# Patient Record
Sex: Female | Born: 1995 | Hispanic: Yes | Marital: Single | Smoking: Never smoker
Health system: Southern US, Community
[De-identification: ages and names within clinical notes are randomized; demographics above are authoritative.]

## PROBLEM LIST (undated history)

## (undated) DIAGNOSIS — E669 Obesity, unspecified: Secondary | ICD-10-CM

## (undated) DIAGNOSIS — R569 Unspecified convulsions: Secondary | ICD-10-CM

## (undated) DIAGNOSIS — J45909 Unspecified asthma, uncomplicated: Secondary | ICD-10-CM

## (undated) DIAGNOSIS — T7840XA Allergy, unspecified, initial encounter: Secondary | ICD-10-CM

---
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
## 2015-04-22 ENCOUNTER — Ambulatory Visit (HOSPITAL_BASED_OUTPATIENT_CLINIC_OR_DEPARTMENT_OTHER): Payer: BLUE CROSS/BLUE SHIELD | Attending: Internal Medicine | Admitting: Radiology

## 2015-04-22 VITALS — Ht 62.0 in | Wt 220.0 lb

## 2015-04-22 DIAGNOSIS — G4719 Other hypersomnia: Secondary | ICD-10-CM | POA: Insufficient documentation

## 2015-04-22 DIAGNOSIS — G4733 Obstructive sleep apnea (adult) (pediatric): Secondary | ICD-10-CM | POA: Diagnosis not present

## 2015-04-22 DIAGNOSIS — R0683 Snoring: Secondary | ICD-10-CM | POA: Diagnosis not present

## 2015-04-22 DIAGNOSIS — Z6841 Body Mass Index (BMI) 40.0 and over, adult: Secondary | ICD-10-CM | POA: Diagnosis not present

## 2015-04-22 DIAGNOSIS — G4730 Sleep apnea, unspecified: Secondary | ICD-10-CM | POA: Diagnosis present

## 2015-04-22 DIAGNOSIS — E669 Obesity, unspecified: Secondary | ICD-10-CM | POA: Diagnosis not present

## 2015-04-27 DIAGNOSIS — G4733 Obstructive sleep apnea (adult) (pediatric): Secondary | ICD-10-CM

## 2015-04-27 NOTE — Progress Notes (Signed)
  Patient Name: Hailey Patrick, [place] Study Date: 04/22/2015 Gender: Female D.O.B: 1996-01-30 Age (years): 19 Referring Provider: Fleet ContrasEdwin Avbuere Height (inches): 62 Interpreting Physician: Jetty Duhamellinton Young MD, ABSM Weight (lbs): 220 RPSGT: Shelah LewandowskyGregory, Kenyon BMI: 40 MRN: [id number] Neck Size: 14.00 CLINICAL INFORMATION Sleep Study Type: NPSG Indication for sleep study: Excessive Daytime Sleepiness, Obesity, OSA, Snoring, Witnessed Apneas Epworth Sleepiness Score: 2  SLEEP STUDY TECHNIQUE As per the AASM Manual for the Scoring of Sleep and Associated Events v2.3 (April 2016) with a hypopnea requiring 4% desaturations. The channels recorded and monitored were frontal, central and occipital EEG, electrooculogram (EOG), submentalis EMG (chin), nasal and oral airflow, thoracic and abdominal wall motion, anterior tibialis EMG, snore microphone, electrocardiogram, and pulse oximetry.  MEDICATIONS Patient's medications include: charted for review. Medications self-administered by patient during sleep study : No sleep medicine administered.  SLEEP ARCHITECTURE The study was initiated at 10:39:17 PM and ended at 5:19:14 AM. Sleep onset time was 57.5 minutes and the sleep efficiency was 76.5%. The total sleep time was 306.0 minutes. Stage REM latency was 190.0 minutes. The patient spent 7.03% of the night in stage N1 sleep, 54.90% in stage N2 sleep, 22.22% in stage N3 and 15.85% in REM. Alpha intrusion was absent. Supine sleep was 20.10%.  RESPIRATORY PARAMETERS The overall apnea/hypopnea index (AHI) was 1.4 per hour. There were 1 total apneas, including 0 obstructive, 1 central and 0 mixed apneas. There were 6 hypopneas and 51 RERAs. The AHI during Stage REM sleep was 1.2 per hour. AHI while supine was 2.9 per hour. The mean oxygen saturation was 96.85%. The minimum SpO2 during sleep was 89.00%. Moderate snoring was noted during this study.  CARDIAC DATA The 2 lead EKG demonstrated sinus  rhythm. The mean heart rate was 77.27 beats per minute. Other EKG findings include: None.  LEG MOVEMENT DATA The total PLMS were 2 with a resulting PLMS index of 0.39. Associated arousal with leg movement index was 0.2 . IMPRESSIONS - No significant obstructive sleep apnea occurred during this study (AHI = 1.4/h). - No significant central sleep apnea occurred during this study (CAI = 0.2/h). - The patient had minimal or no oxygen desaturation during the study (Min O2 = 89.00%) - The patient snored with Moderate snoring volume. - No cardiac abnormalities were noted during this study. - Clinically significant periodic limb movements did not occur during sleep. No significant associated arousals.  DIAGNOSIS - Normal study  RECOMMENDATIONS - Avoid alcohol, sedatives and other CNS depressants that may worsen sleep apnea and disrupt normal sleep architecture. - Sleep hygiene should be reviewed to assess factors that may improve sleep quality. - Weight management and regular exercise should be initiated or continued if appropriate.  Waymon BudgeYOUNG,CLINTON D Diplomate, American Board of Sleep Medicine  ELECTRONICALLY SIGNED ON:  04/27/2015, 3:00 PM [facility] PH: [phone]   FX: [phone] ACCREDITED BY THE AMERICAN ACADEMY OF SLEEP MEDICINE

## 2016-11-30 ENCOUNTER — Encounter (HOSPITAL_COMMUNITY): Payer: Self-pay | Admitting: Emergency Medicine

## 2016-11-30 ENCOUNTER — Emergency Department (HOSPITAL_COMMUNITY): Payer: BLUE CROSS/BLUE SHIELD

## 2016-11-30 ENCOUNTER — Emergency Department (HOSPITAL_COMMUNITY)
Admission: EM | Admit: 2016-11-30 | Discharge: 2016-11-30 | Disposition: A | Discharge status: Home / Self Care | Payer: BLUE CROSS/BLUE SHIELD | Attending: Emergency Medicine | Admitting: Emergency Medicine

## 2016-11-30 DIAGNOSIS — Y92410 Unspecified street and highway as the place of occurrence of the external cause: Secondary | ICD-10-CM | POA: Diagnosis not present

## 2016-11-30 DIAGNOSIS — R109 Unspecified abdominal pain: Secondary | ICD-10-CM | POA: Insufficient documentation

## 2016-11-30 DIAGNOSIS — S30811A Abrasion of abdominal wall, initial encounter: Secondary | ICD-10-CM | POA: Diagnosis not present

## 2016-11-30 DIAGNOSIS — Y999 Unspecified external cause status: Secondary | ICD-10-CM | POA: Diagnosis not present

## 2016-11-30 DIAGNOSIS — Y939 Activity, unspecified: Secondary | ICD-10-CM | POA: Insufficient documentation

## 2016-11-30 DIAGNOSIS — T07XXXA Unspecified multiple injuries, initial encounter: Secondary | ICD-10-CM

## 2016-11-30 DIAGNOSIS — Z79899 Other long term (current) drug therapy: Secondary | ICD-10-CM | POA: Diagnosis not present

## 2016-11-30 DIAGNOSIS — Z7982 Long term (current) use of aspirin: Secondary | ICD-10-CM | POA: Diagnosis not present

## 2016-11-30 DIAGNOSIS — M79644 Pain in right finger(s): Secondary | ICD-10-CM | POA: Diagnosis present

## 2016-11-30 LAB — I-STAT CHEM 8, ED
BUN: 9 mg/dL (ref 6–20)
Calcium, Ion: 1.14 mmol/L — ABNORMAL LOW (ref 1.15–1.40)
Chloride: 103 mmol/L (ref 101–111)
Creatinine, Ser: 0.6 mg/dL (ref 0.44–1.00)
Glucose, Bld: 92 mg/dL (ref 65–99)
HEMATOCRIT: 43 % (ref 36.0–46.0)
Hemoglobin: 14.6 g/dL (ref 12.0–15.0)
Potassium: 4 mmol/L (ref 3.5–5.1)
SODIUM: 140 mmol/L (ref 135–145)
TCO2: 25 mmol/L (ref 22–32)

## 2016-11-30 LAB — CBC WITH DIFFERENTIAL/PLATELET
BASOS PCT: 0 %
Basophils Absolute: 0 10*3/uL (ref 0.0–0.1)
EOS ABS: 0.3 10*3/uL (ref 0.0–0.7)
Eosinophils Relative: 3 %
HCT: 41.7 % (ref 36.0–46.0)
Hemoglobin: 13.6 g/dL (ref 12.0–15.0)
Lymphocytes Relative: 17 %
Lymphs Abs: 1.9 10*3/uL (ref 0.7–4.0)
MCH: 27.4 pg (ref 26.0–34.0)
MCHC: 32.6 g/dL (ref 30.0–36.0)
MCV: 83.9 fL (ref 78.0–100.0)
MONO ABS: 0.5 10*3/uL (ref 0.1–1.0)
Monocytes Relative: 5 %
Neutro Abs: 8.1 10*3/uL — ABNORMAL HIGH (ref 1.7–7.7)
Neutrophils Relative %: 75 %
Platelets: 334 10*3/uL (ref 150–400)
RBC: 4.97 MIL/uL (ref 3.87–5.11)
RDW: 13.7 % (ref 11.5–15.5)
WBC: 10.7 10*3/uL — ABNORMAL HIGH (ref 4.0–10.5)

## 2016-11-30 LAB — CBG MONITORING, ED: Glucose-Capillary: 68 mg/dL (ref 65–99)

## 2016-11-30 LAB — POC URINE PREG, ED: Preg Test, Ur: NEGATIVE

## 2016-11-30 MED ORDER — IOPAMIDOL (ISOVUE-300) INJECTION 61%
INTRAVENOUS | Status: AC
  Administered 2016-11-30: 100 mL
  Filled 2016-11-30: qty 100

## 2016-11-30 MED ORDER — CYCLOBENZAPRINE HCL 10 MG PO TABS: 10.0000 mg | ORAL_TABLET | Freq: Every evening | ORAL | 0 refills | Status: DC | PRN

## 2016-11-30 NOTE — Discharge Instructions (Signed)
Please read instructions below. Apply ice to your hand for 20 minutes at a time. You can take 600 mg of Advil/ibuprofen every 6 hours as needed for pain. You can take flexeril at bedtime or up to every 12 hours as needed for muscle spasm. You will have muscle soreness these next few days and that is normal. Schedule an appointment with your primary care provider if finger pain persists.  Return to ER if new numbness or tingling in your arms or legs, inability to urinate, inability to hold your bowels, weakness in your extremities, severe headache, vision changes, vomiting or new or concerning symptoms.
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
## 2021-03-26 ENCOUNTER — Other Ambulatory Visit: Payer: Self-pay | Admitting: Internal Medicine

## 2021-03-26 ENCOUNTER — Other Ambulatory Visit: Payer: Self-pay

## 2021-03-26 ENCOUNTER — Ambulatory Visit
Admission: RE | Admit: 2021-03-26 | Discharge: 2021-03-26 | Disposition: A | Discharge status: Home / Self Care | Payer: Self-pay | Source: Ambulatory Visit | Attending: Internal Medicine | Admitting: Internal Medicine

## 2021-03-26 DIAGNOSIS — R0602 Shortness of breath: Secondary | ICD-10-CM

## 2022-04-06 IMAGING — CR DG CHEST 2V
3 series · 3 of 3 positions shown · non-contrast
Comparison: Chest radiograph 11/30/2016

CLINICAL DATA: Shortness of breath.

EXAM:
CHEST - 2 VIEW

[w chest pa]
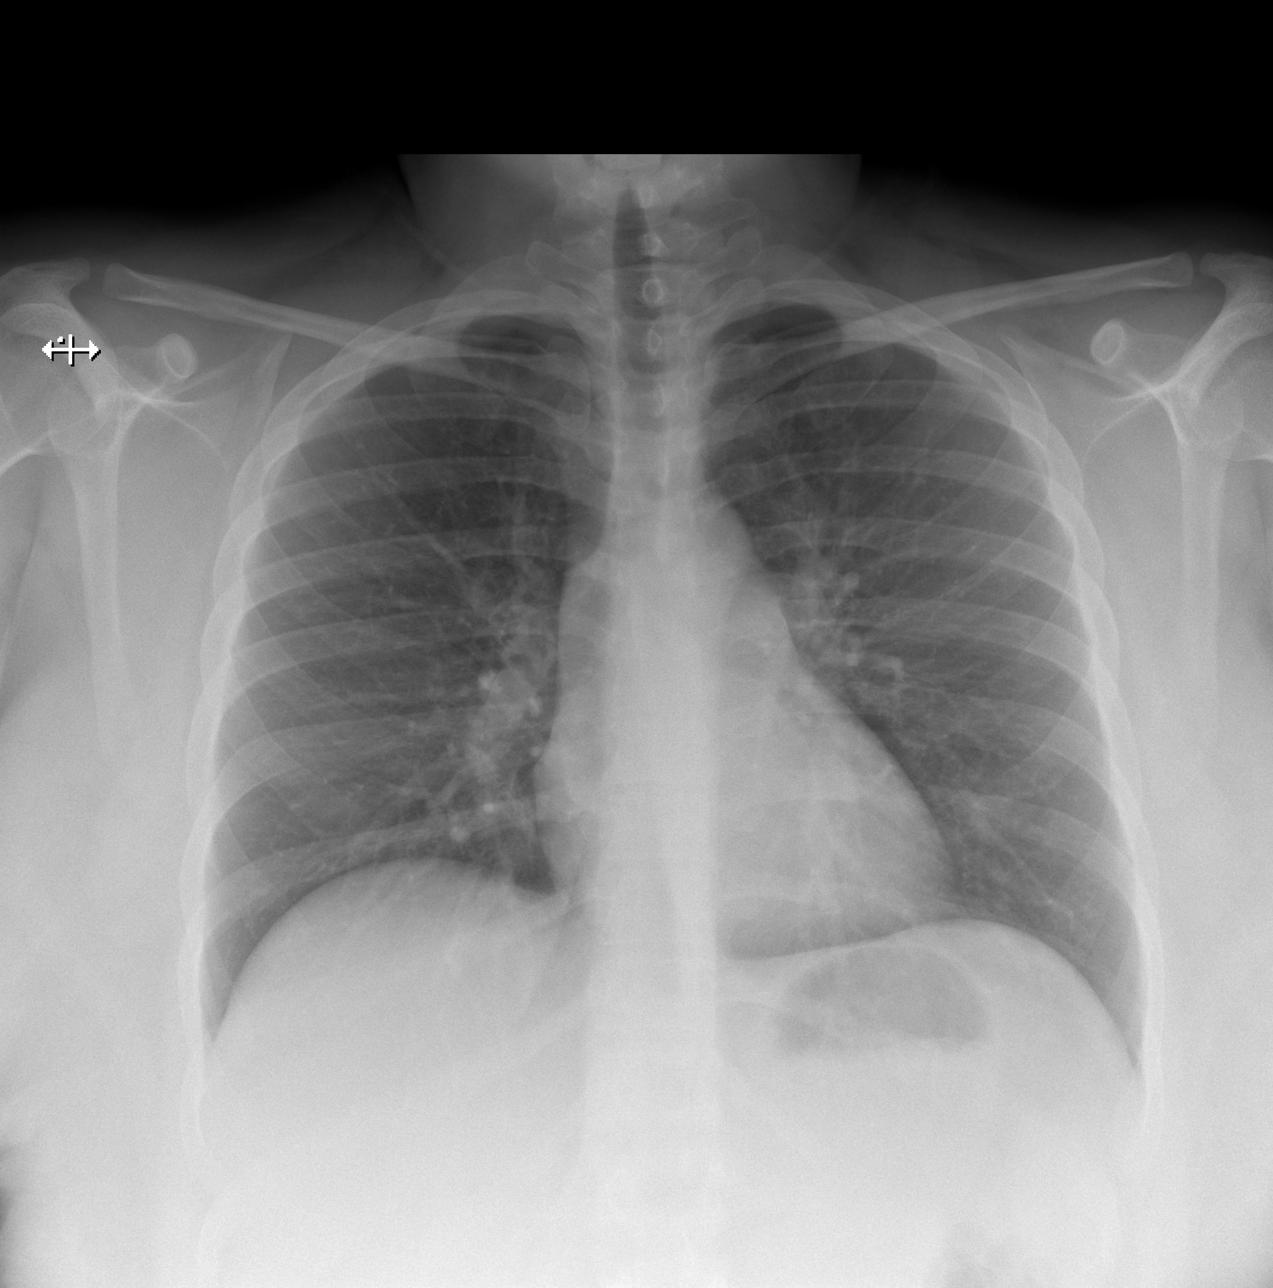

[w chest lat (1 of 2)]
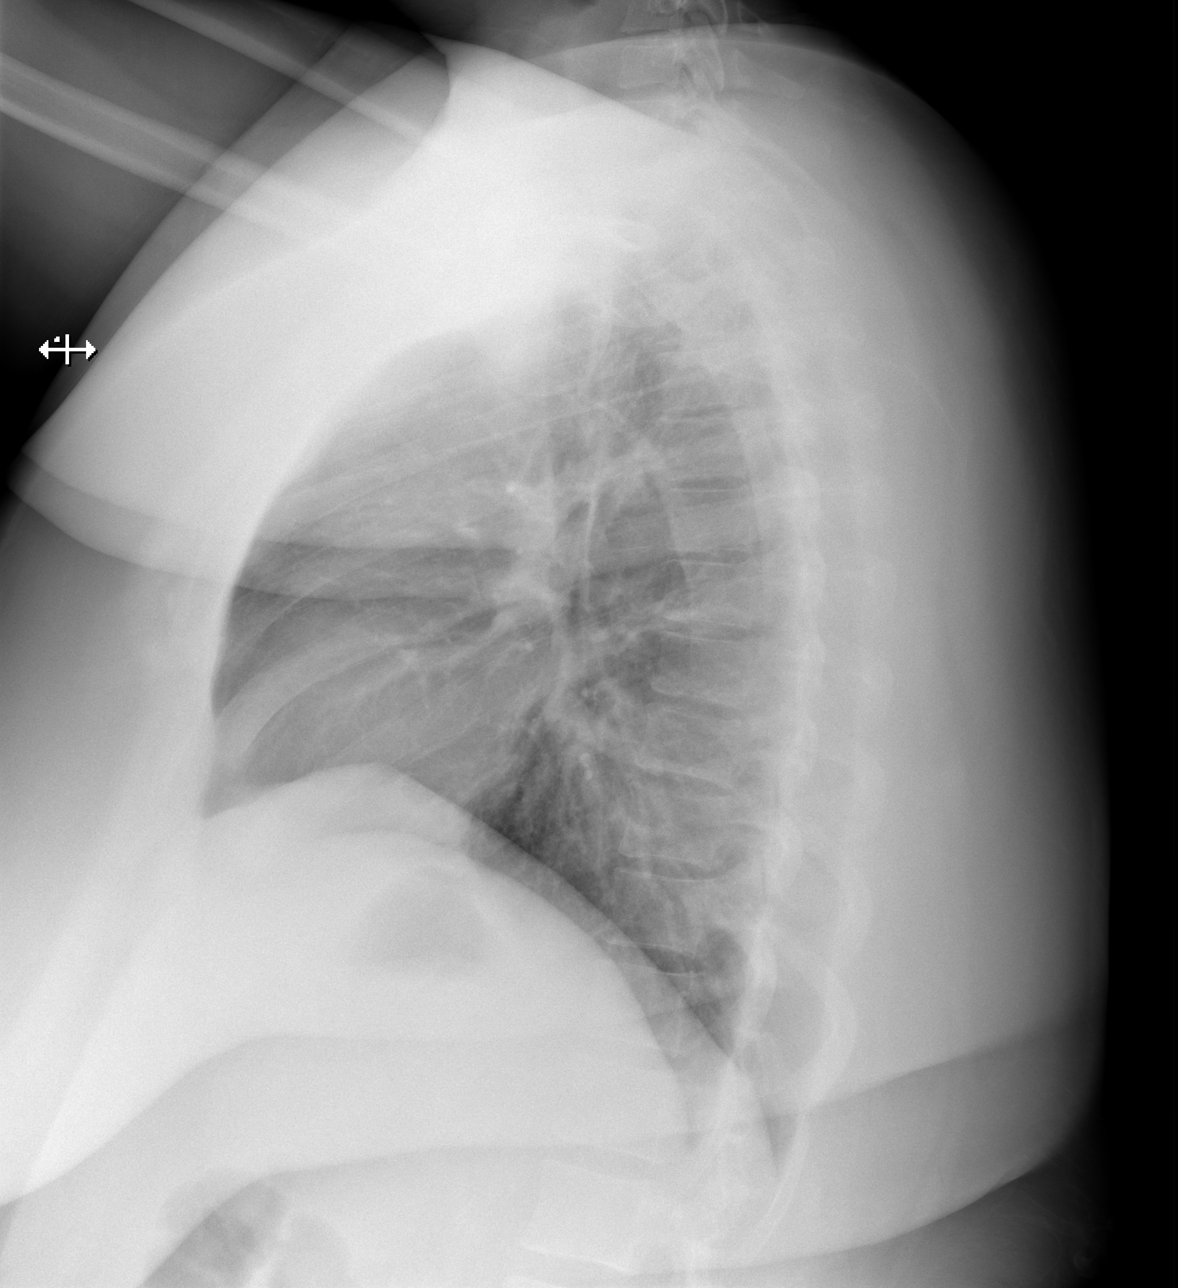

[w chest lat (2 of 2)]
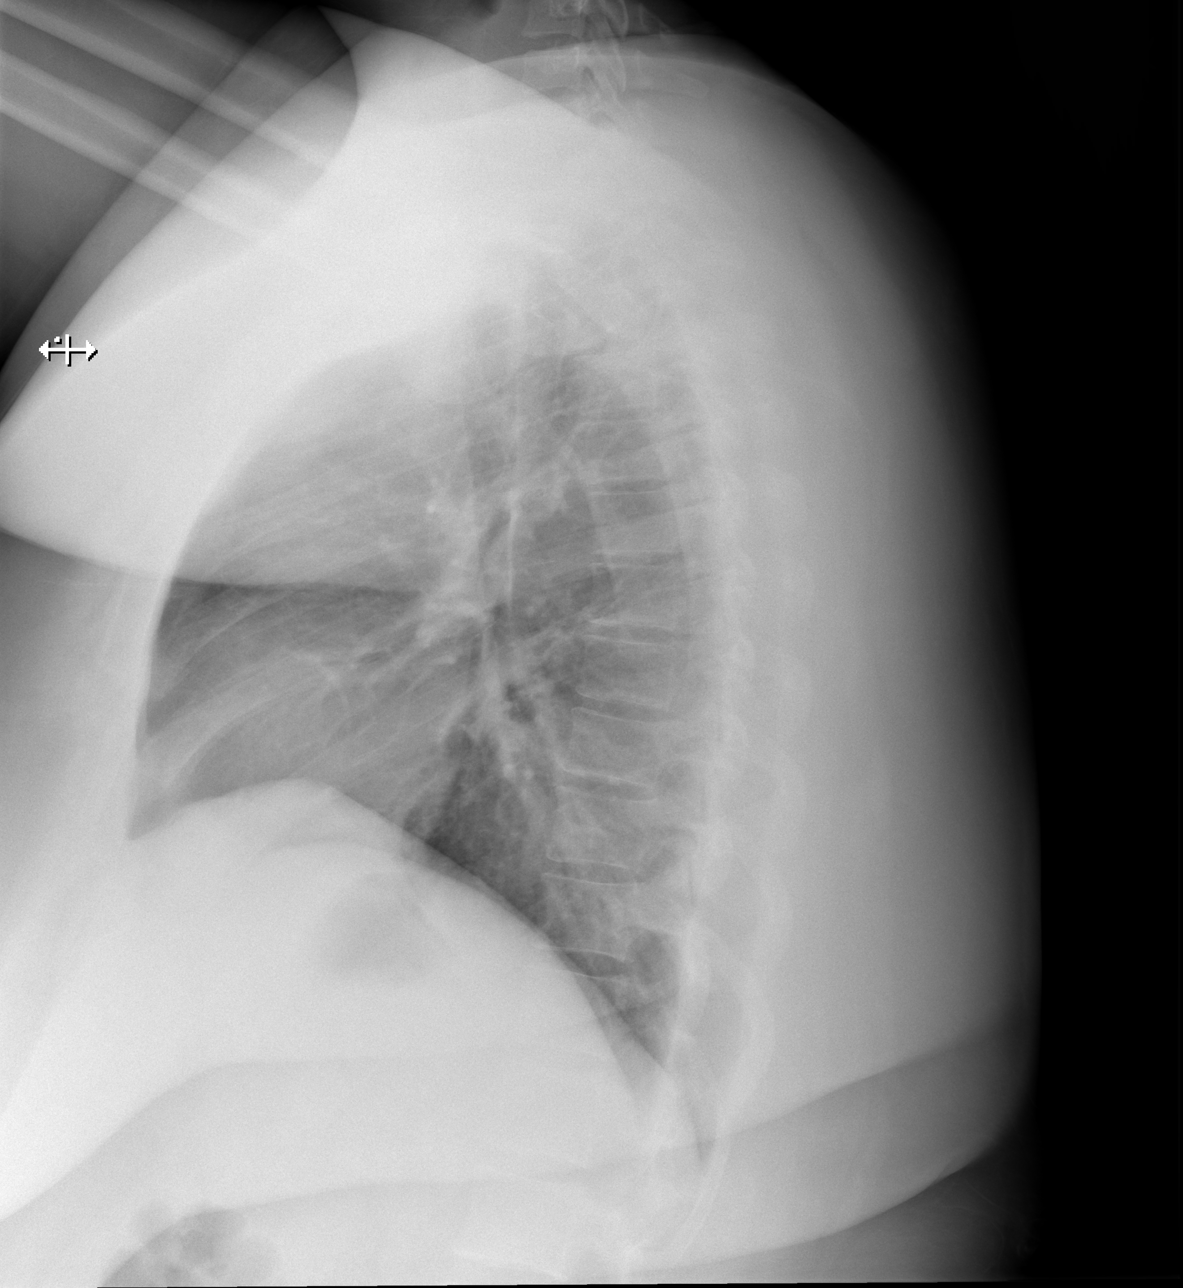

[3 of 3 positions shown; findings below may reference images not displayed]

FINDINGS: The cardiomediastinal contours are normal. The lungs are clear.
Pulmonary vasculature is normal. No consolidation, pleural effusion,
or pneumothorax. No acute osseous abnormalities are seen.
IMPRESSION: Negative radiographs of the chest.

## 2022-12-14 ENCOUNTER — Other Ambulatory Visit: Payer: Self-pay | Admitting: Family Medicine

## 2022-12-14 DIAGNOSIS — M25511 Pain in right shoulder: Secondary | ICD-10-CM

## 2022-12-14 DIAGNOSIS — G8929 Other chronic pain: Secondary | ICD-10-CM

## 2022-12-14 DIAGNOSIS — M25512 Pain in left shoulder: Secondary | ICD-10-CM

## 2022-12-14 NOTE — Progress Notes (Signed)
Bilat knee and shoulder pain L wrist pain Increase in time at gym working out w/ trainer.  Minimal discomfort when not working out regularly.  Pain is inhibiting ability to continue w/ working out.  PT referral placed.

## 2022-12-21 ENCOUNTER — Ambulatory Visit (INDEPENDENT_AMBULATORY_CARE_PROVIDER_SITE_OTHER): Payer: 59 | Admitting: Physical Therapy

## 2022-12-21 ENCOUNTER — Other Ambulatory Visit: Payer: Self-pay

## 2022-12-21 DIAGNOSIS — M25572 Pain in left ankle and joints of left foot: Secondary | ICD-10-CM

## 2022-12-21 DIAGNOSIS — M25561 Pain in right knee: Secondary | ICD-10-CM | POA: Diagnosis not present

## 2022-12-21 DIAGNOSIS — G8929 Other chronic pain: Secondary | ICD-10-CM

## 2022-12-21 DIAGNOSIS — M25571 Pain in right ankle and joints of right foot: Secondary | ICD-10-CM

## 2022-12-21 DIAGNOSIS — M25562 Pain in left knee: Secondary | ICD-10-CM

## 2022-12-21 DIAGNOSIS — M6281 Muscle weakness (generalized): Secondary | ICD-10-CM | POA: Diagnosis not present

## 2022-12-21 NOTE — Therapy (Signed)
OUTPATIENT PHYSICAL THERAPY LOWER EXTREMITY EVALUATION   Patient Name: Hailey Patrick MRN: [id number] DOB:20-Jan-1996, 27 y.o., female Today's Date: 12/21/2022  END OF SESSION:  PT End of Session - 12/21/22 1643     Visit Number 1    Number of Visits 17    Date for PT Re-Evaluation 02/15/23    Authorization Type [employer]    PT Start Time 1518    PT Stop Time 1601    PT Time Calculation (min) 43 min    Activity Tolerance Patient tolerated treatment well;No increased pain    Behavior During Therapy WFL for tasks assessed/performed             Past Medical History:  Diagnosis Date   Allergy    Sesonal   Asthma    as a young child   Obesity    Seizures (HCC)    febrile age of 2. Stare spell- last one at 54 when menes begin   Past Surgical History:  Procedure Laterality Date   ANTERIOR CRUCIATE LIGAMENT REPAIR Right 08/29/2012   Procedure: RIGHT KNEE ANTERIOR CRUCIATE LIGAMENT RECONSTRUCTION, HAMSTRING AUTOGRAFT;  Surgeon: Cammy Copa, MD;  Location: [facility] OR;  Service: Orthopedics;  Laterality: Right;   There are no problems to display for this patient.   PCP: Fleet Contras, MD  REFERRING PROVIDER: Ozella Rocks, MD  REFERRING DIAG: Bilateral knee, foot, and shoulder pain  THERAPY DIAG:  Hypermobility of bilateral knees and ankle/feet. Muscular performance deficits of Bil Les.  Rationale for Evaluation and Treatment: Rehabilitation  ONSET DATE: Over a year ago  SUBJECTIVE:   SUBJECTIVE STATEMENT:  Eval: Pt states from January 2023 until now, she lost > 100 lbs and has developed increasing pain in her knees, feet, and bilateral shoulders. Pt's foot pain started a few years ago, knee pain within the last year, and shoulder pain within last of weeks. Pt states her MD told her a probable cause was the changing of her body mechanics secondary to weight loss. Aggravating factors for her knees & feet include, stairs, jumping, running, working  out, & speed walking. Alleviating factors include, Voltaren, ice, heat or Ibuprofen. Pt states she does experience N/T in her feet, but believes it may be caused by the new compression sleeves her MD told her to use. Pt had L ACL surgery 10 years ago.   PERTINENT HISTORY: Acl surgery 2014,   PAIN:  Are you having pain? Yes: NPRS scale: 6/10 Pain location: Bil knees R>L.  Pain description: sore Aggravating factors: standing, walking, stairs,  Relieving factors: none stated    Are you having pain? No and Yes: NPRS scale: 7/10 Pain location: Bil feet Pain description: sore, sharp Aggravating factors: jumping, workouts  Relieving factors: rest  Are you having pain? No and Yes: NPRS scale: 4/10 Pain location: Bil shoulders Pain description: sore Aggravating factors: activity  Relieving factors: none stated    PRECAUTIONS: None  RED FLAGS: None   WEIGHT BEARING RESTRICTIONS: No  FALLS:  Has patient fallen in last 6 months? No  LIVING ENVIRONMENT: Lives with: lives with their family Lives in: House/apartment Stairs: Yes: External: 2 floors steps; can reach both Has following equipment at home: None  OCCUPATION: works at a desk  PLOF: Independent  PATIENT GOALS: improve ability to navigate stairs, run, speed walking, jump, and workout with reduced pain/limitation  NEXT MD VISIT: February 2025  OBJECTIVE:  Note: Objective measures were completed at Evaluation unless otherwise noted.  DIAGNOSTIC FINDINGS:  PATIENT SURVEYS:  FOTO 64 (12/21/22)  COGNITION: Overall cognitive status: Within functional limits for tasks assessed     SENSATION: WFL   MUSCLE LENGTH: Hamstrings: Right WFL ; Left WFL    POSTURE: min R knee valgus and mild ankle overpronation (R>L). Bil knee hyperextension.   PALPATION: TTP bil medial cuneiform/ navicular and bil lateral knee joint line  LOWER EXTREMITY ROM: Hip ROM: WFL ,  Knee ROM: WFL hypermobile extension bilaterally,  Ankle  ROM: WFL, hypermobile inversion  LOWER EXTREMITY MMT:  MMT Right eval Left eval  Hip flexion    Hip extension 5 4+  Hip abduction 5 5  Hip adduction    Hip internal rotation    Hip external rotation    Knee flexion 4 4  Knee extension 4 4  Ankle dorsiflexion    Ankle plantarflexion    Ankle inversion    Ankle eversion     (Blank rows = not tested)  LOWER EXTREMITY SPECIAL TESTS:    FUNCTIONAL TESTS:  Single Leg hop Test:  R- Pt able to hop for 10 secs before stopping d/t pain in foot.  L- Pt able to hop for 8 secs before stopping d/t pain in L knee   GAIT: Distance walked: 25 ft Assistive device utilized: None Level of assistance: Complete Independence Comments: R dynamic knee valgus and overpronation of R foot   TODAY'S TREATMENT:                                                                                                                              DATE:  12/21/2022  Therapeutic Exercise: Aerobic: Supine: Seated: Standing: calf raises x20 Stretches:  Neuromuscular Re-education: Manual Therapy: Therapeutic Activity: Self Care:     PATIENT EDUCATION:  Education details: Pt POC and progression Person educated: Patient Education method: Programmer, multimedia, Demonstration, and Verbal cues Education comprehension: verbalized understanding and returned demonstration  HOME EXERCISE PROGRAM: Will provide in future sessions  ASSESSMENT:  CLINICAL IMPRESSION: Patient is a 27 y.o.f who was seen today for physical therapy evaluation and treatment for bil foot and knee pain. Primary impairments include hypermobility of Bil knees and feet/ankles and muscular performance deficits of bil knee & feet/ankles. She also has bilateral shoulder pain, plan to give pt HEP for this in future sessions, but will focus on knee and ankle pain at this time. These impairments limit her ability to run, speed walk, navigate stairs, jump, and workout without pain/limitation. Pt will  benefit from skilled physical therapy to work on these impairments.  OBJECTIVE IMPAIRMENTS: decreased activity tolerance, decreased coordination, decreased endurance, decreased strength, and pain.   ACTIVITY LIMITATIONS: carrying, lifting, bending, standing, squatting, stairs, and locomotion level  PARTICIPATION LIMITATIONS:  working out with Systems analyst  PERSONAL FACTORS: Past/current experiences are also affecting patient's functional outcome.   REHAB POTENTIAL: Good  CLINICAL DECISION MAKING: Stable/uncomplicated  EVALUATION COMPLEXITY: Low   GOALS: Goals reviewed with patient? Yes  SHORT TERM GOALS:  Target date: 01/04/2023  Pt will be independent in initial HEP Goal status: INITIAL  2.  Pt will report ability to speed walk with less than 3/10 pain.   Goal status: INITIAL     LONG TERM GOALS: Target date: 02/15/2023  Pt will report 75% improvement in the ability to navigate stairs.  Goal status: INITIAL  2.  Pt will report ability to run for 10 minutes with <3/10 pain in her knees & feet.  Goal status: INITIAL  3.  Pt will report 75% improvement in the her knee and ankle pain with LE workout   Goal status: INITIAL  4.   Pt will demo ability to perform 20 seconds of single leg hops to promote ability to return to running.   Goal status: INITIAL     PLAN:  PT FREQUENCY: 1-2x/week  PT DURATION: 8 weeks  PLANNED INTERVENTIONS: 97110-Therapeutic exercises, 97530- Therapeutic activity, 97112- Neuromuscular re-education, 97535- Self Care, 36644- Manual therapy, [phone]- Ionotophoresis 4mg /ml Dexamethasone, Patient/Family education, Balance training, Stair training, Taping, Dry Needling, Joint mobilization, Joint manipulation, Spinal manipulation, Spinal mobilization, Cryotherapy, and Moist heat  PLAN FOR NEXT SESSION: Begin foot & arch strengthening exercises as tolerated, quad and hip strength,   [place]ech[place] Guard, SPT   This entire session was performed  under direct supervision and direction of a licensed Estate agent . I have personally read, edited and approve of the note as written.  Sedalia Muta, PT, DPT 10:08 AM  12/22/22

## 2022-12-22 ENCOUNTER — Encounter: Payer: Self-pay | Admitting: Physical Therapy

## 2022-12-29 ENCOUNTER — Encounter: Payer: 59 | Admitting: Physical Therapy

## 2022-12-30 ENCOUNTER — Encounter: Payer: Self-pay | Admitting: Physical Therapy

## 2022-12-30 ENCOUNTER — Ambulatory Visit (INDEPENDENT_AMBULATORY_CARE_PROVIDER_SITE_OTHER): Payer: 59 | Admitting: Physical Therapy

## 2022-12-30 DIAGNOSIS — M25571 Pain in right ankle and joints of right foot: Secondary | ICD-10-CM | POA: Diagnosis not present

## 2022-12-30 DIAGNOSIS — M25562 Pain in left knee: Secondary | ICD-10-CM

## 2022-12-30 DIAGNOSIS — M25572 Pain in left ankle and joints of left foot: Secondary | ICD-10-CM | POA: Diagnosis not present

## 2022-12-30 DIAGNOSIS — M6281 Muscle weakness (generalized): Secondary | ICD-10-CM

## 2022-12-30 DIAGNOSIS — G8929 Other chronic pain: Secondary | ICD-10-CM

## 2022-12-30 DIAGNOSIS — M25561 Pain in right knee: Secondary | ICD-10-CM | POA: Diagnosis not present

## 2022-12-30 NOTE — Therapy (Signed)
OUTPATIENT PHYSICAL THERAPY LOWER EXTREMITY Treatment   Patient Name: Hailey Patrick MRN: [id number] DOB:[birth year], 27 y.o., female Today's Date: 12/21/2022  END OF SESSION:  PT End of Session - 12/30/22 1520     Visit Number 2    Number of Visits 16    Date for PT Re-Evaluation 02/15/23    Authorization Type [employer]    PT Start Time 1520    PT Stop Time 1558    PT Time Calculation (min) 38 min    Activity Tolerance Patient tolerated treatment well;No increased pain    Behavior During Therapy WFL for tasks assessed/performed              Past Medical History:  Diagnosis Date   Allergy    Sesonal   Asthma    as a young child   Obesity    Seizures (HCC)    febrile age of 2. Stare spell- last one at 32 when menes begin   Past Surgical History:  Procedure Laterality Date   ANTERIOR CRUCIATE LIGAMENT REPAIR Right 08/29/2012   Procedure: RIGHT KNEE ANTERIOR CRUCIATE LIGAMENT RECONSTRUCTION, HAMSTRING AUTOGRAFT;  Surgeon: Cammy Copa, MD;  Location: [facility] OR;  Service: Orthopedics;  Laterality: Right;   There are no problems to display for this patient.   PCP: Fleet Contras, MD  REFERRING PROVIDER: Ozella Rocks, MD  REFERRING DIAG: Bilateral knee, foot, and shoulder pain  THERAPY DIAG:  Hypermobility of bilateral knees and ankle/feet. Muscular performance deficits of Bil Les.  Rationale for Evaluation and Treatment: Rehabilitation  ONSET DATE: Over a year ago  SUBJECTIVE:   SUBJECTIVE STATEMENT: 12/30/2022 States that she is not having pain at the moment. States she had a shooting pain in her right calf and her pain in her right knee all week with going up and down the stairs and down is worse - lives on the third floor.  Eval: Pt states from January 2023 until now, she lost > 100 lbs and has developed increasing pain in her knees, feet, and bilateral shoulders. Pt's foot pain started a few years ago, knee pain within the last year,  and shoulder pain within last of weeks. Pt states her MD told her a probable cause was the changing of her body mechanics secondary to weight loss. Aggravating factors for her knees & feet include, stairs, jumping, running, working out, & speed walking. Alleviating factors include, Voltaren, ice, heat or Ibuprofen. Pt states she does experience N/T in her feet, but believes it may be caused by the new compression sleeves her MD told her to use. Pt had L ACL surgery 10 years ago.   PERTINENT HISTORY: Acl surgery 2014,   PAIN:  Are you having pain? Yes: NPRS scale: 6/10 Pain location: Bil knees R>L.  Pain description: sore Aggravating factors: standing, walking, stairs,  Relieving factors: none stated       PRECAUTIONS: None  RED FLAGS: None   WEIGHT BEARING RESTRICTIONS: No  FALLS:  Has patient fallen in last 6 months? No  LIVING ENVIRONMENT: Lives with: lives with their family Lives in: House/apartment Stairs: Yes: External: 2 floors steps; can reach both Has following equipment at home: None  OCCUPATION: works at a desk  PLOF: Independent  PATIENT GOALS: improve ability to navigate stairs, run, speed walking, jump, and workout with reduced pain/limitation  NEXT MD VISIT: February 2025  OBJECTIVE:  Note: Objective measures were completed at Evaluation unless otherwise noted.  DIAGNOSTIC FINDINGS:   PATIENT SURVEYS:  FOTO 64 (12/21/22)  COGNITION: Overall cognitive status: Within functional limits for tasks assessed     SENSATION: WFL   MUSCLE LENGTH: Hamstrings: Right WFL ; Left WFL    POSTURE: min R knee valgus and mild ankle overpronation (R>L). Bil knee hyperextension.   PALPATION: TTP bil medial cuneiform/ navicular and bil lateral knee joint line  LOWER EXTREMITY ROM: Hip ROM: WFL ,  Knee ROM: WFL hypermobile extension bilaterally,  Ankle ROM: WFL, hypermobile inversion  LOWER EXTREMITY MMT:  MMT Right eval Left eval  Hip flexion    Hip  extension 5 4+  Hip abduction 5 5  Hip adduction    Hip internal rotation    Hip external rotation    Knee flexion 4 4  Knee extension 4 4  Ankle dorsiflexion    Ankle plantarflexion    Ankle inversion    Ankle eversion     (Blank rows = not tested)  LOWER EXTREMITY SPECIAL TESTS:    FUNCTIONAL TESTS:  Single Leg hop Test:  R- Pt able to hop for 10 secs before stopping d/t pain in foot.  L- Pt able to hop for 8 secs before stopping d/t pain in L knee   GAIT: Distance walked: 25 ft Assistive device utilized: None Level of assistance: Complete Independence Comments: R dynamic knee valgus and overpronation of R foot   TODAY'S TREATMENT:                                                                                                                              DATE: 12/30/22  Therapeutic Exercise: Aerobic: Supine: bridges post tilt with anterior weight shift x25, quad set with ASLR 5x5 B slow and controlled Seated: LAQs x25 5" holds- focus on quad set Standing:  Stretches:  Neuromuscular Re-education: on posture education - on not locking her knees out - difference between passive and active postures Manual Therapy: Therapeutic Activity: Self Care:     PATIENT EDUCATION:  Education details: HEP, rationale behind interventions, what muscles she should focus on, standing postures Person educated: Patient Education method: Explanation, Demonstration, and Verbal cues Education comprehension: verbalized understanding and returned demonstration  HOME EXERCISE PROGRAM: [email or web address]  ASSESSMENT:  CLINICAL IMPRESSION: 12/30/22 Session focused on initiation of HEP with focus on proper muscles activation secondary to patient demonstrating compensatory strategies initially with all exercises and patient with reduced proprioceptive awareness. Verbal and tactile cues for proper muscle activation, patient tolerated this well and no pain noted during or after session. Added all  exercises to HEP. Will continue with current POC as tolerated.    Eval: Patient is a 27 y.o.f who was seen today for physical therapy evaluation and treatment for bil foot and knee pain. Primary impairments include hypermobility of Bil knees and feet/ankles and muscular performance deficits of bil knee & feet/ankles. She also has bilateral shoulder pain, plan to give pt HEP for this in future sessions, but will focus on knee and ankle  pain at this time. These impairments limit her ability to run, speed walk, navigate stairs, jump, and workout without pain/limitation. Pt will benefit from skilled physical therapy to work on these impairments.  OBJECTIVE IMPAIRMENTS: decreased activity tolerance, decreased coordination, decreased endurance, decreased strength, and pain.   ACTIVITY LIMITATIONS: carrying, lifting, bending, standing, squatting, stairs, and locomotion level  PARTICIPATION LIMITATIONS:  working out with Systems analyst  PERSONAL FACTORS: Past/current experiences are also affecting patient's functional outcome.   REHAB POTENTIAL: Good  CLINICAL DECISION MAKING: Stable/uncomplicated  EVALUATION COMPLEXITY: Low   GOALS: Goals reviewed with patient? Yes  SHORT TERM GOALS: Target date: 01/04/2023  Pt will be independent in initial HEP Goal status: INITIAL  2.  Pt will report ability to speed walk with less than 3/10 pain.   Goal status: INITIAL     LONG TERM GOALS: Target date: 02/15/2023  Pt will report 75% improvement in the ability to navigate stairs.  Goal status: INITIAL  2.  Pt will report ability to run for 10 minutes with <3/10 pain in her knees & feet.  Goal status: INITIAL  3.  Pt will report 75% improvement in the her knee and ankle pain with LE workout   Goal status: INITIAL  4.   Pt will demo ability to perform 20 seconds of single leg hops to promote ability to return to running.   Goal status: INITIAL     PLAN:  PT FREQUENCY:  1-2x/week  PT DURATION: 8 weeks  PLANNED INTERVENTIONS: 97110-Therapeutic exercises, 97530- Therapeutic activity, 97112- Neuromuscular re-education, 97535- Self Care, 56213- Manual therapy, [phone]- Ionotophoresis 4mg /ml Dexamethasone, Patient/Family education, Balance training, Stair training, Taping, Dry Needling, Joint mobilization, Joint manipulation, Spinal manipulation, Spinal mobilization, Cryotherapy, and Moist heat  PLAN FOR NEXT SESSION: LE strengthening, active vs passive posture reviewed and educated - patient likes verbal/tactile cues and to know where she should feel it (needs additional cues to avoid compensatory strategies).  10:58 AM, 12/31/22 Tereasa Coop, DPT Physical Therapy with [place]

## 2023-01-03 ENCOUNTER — Encounter: Payer: 59 | Admitting: Physical Therapy

## 2023-01-04 ENCOUNTER — Encounter: Payer: Self-pay | Admitting: Physical Therapy

## 2023-01-04 ENCOUNTER — Ambulatory Visit (INDEPENDENT_AMBULATORY_CARE_PROVIDER_SITE_OTHER): Payer: 59 | Admitting: Physical Therapy

## 2023-01-04 DIAGNOSIS — M25572 Pain in left ankle and joints of left foot: Secondary | ICD-10-CM

## 2023-01-04 DIAGNOSIS — M25561 Pain in right knee: Secondary | ICD-10-CM | POA: Diagnosis not present

## 2023-01-04 DIAGNOSIS — M6281 Muscle weakness (generalized): Secondary | ICD-10-CM

## 2023-01-04 DIAGNOSIS — M25571 Pain in right ankle and joints of right foot: Secondary | ICD-10-CM | POA: Diagnosis not present

## 2023-01-04 DIAGNOSIS — G8929 Other chronic pain: Secondary | ICD-10-CM

## 2023-01-04 DIAGNOSIS — M25562 Pain in left knee: Secondary | ICD-10-CM

## 2023-01-04 NOTE — Therapy (Signed)
OUTPATIENT PHYSICAL THERAPY LOWER EXTREMITY Treatment   Patient Name: Hailey Patrick MRN: [id number] DOB:08-Jun-1995, 27 y.o., female Today's Date: 12/21/2022  END OF SESSION:  PT End of Session - 01/04/23 1348     Visit Number 3    Number of Visits 16    Date for PT Re-Evaluation 02/15/23    Authorization Type [employer]    PT Start Time 1349    PT Stop Time 1427    PT Time Calculation (min) 38 min    Activity Tolerance Patient tolerated treatment well;No increased pain    Behavior During Therapy WFL for tasks assessed/performed              Past Medical History:  Diagnosis Date   Allergy    Sesonal   Asthma    as a young child   Obesity    Seizures (HCC)    febrile age of 2. Stare spell- last one at 47 when menes begin   Past Surgical History:  Procedure Laterality Date   ANTERIOR CRUCIATE LIGAMENT REPAIR Right 08/29/2012   Procedure: RIGHT KNEE ANTERIOR CRUCIATE LIGAMENT RECONSTRUCTION, HAMSTRING AUTOGRAFT;  Surgeon: Cammy Copa, MD;  Location: [facility] OR;  Service: Orthopedics;  Laterality: Right;   There are no problems to display for this patient.   PCP: Fleet Contras, MD  REFERRING PROVIDER: Ozella Rocks, MD  REFERRING DIAG: Bilateral knee, foot, and shoulder pain  THERAPY DIAG:  Hypermobility of bilateral knees and ankle/feet. Muscular performance deficits of Bil Les.  Rationale for Evaluation and Treatment: Rehabilitation  ONSET DATE: Over a year ago  SUBJECTIVE:   SUBJECTIVE STATEMENT: 01/04/2023 States her ankle pain has been a little bit more on the left side on both sides but was dancing a lot this weekend.   Eval: Pt states from January 2023 until now, she lost > 100 lbs and has developed increasing pain in her knees, feet, and bilateral shoulders. Pt's foot pain started a few years ago, knee pain within the last year, and shoulder pain within last of weeks. Pt states her MD told her a probable cause was the changing  of her body mechanics secondary to weight loss. Aggravating factors for her knees & feet include, stairs, jumping, running, working out, & speed walking. Alleviating factors include, Voltaren, ice, heat or Ibuprofen. Pt states she does experience N/T in her feet, but believes it may be caused by the new compression sleeves her MD told her to use. Pt had L ACL surgery 10 years ago.   PERTINENT HISTORY: Acl surgery 2014,   PAIN:  Are you having pain? Yes: NPRS scale: 0/10 Pain location: Bil knees R>L.  Pain description: sore Aggravating factors: standing, walking, stairs,  Relieving factors: none stated       PRECAUTIONS: None  RED FLAGS: None   WEIGHT BEARING RESTRICTIONS: No  FALLS:  Has patient fallen in last 6 months? No  LIVING ENVIRONMENT: Lives with: lives with their family Lives in: House/apartment Stairs: Yes: External: 2 floors steps; can reach both Has following equipment at home: None  OCCUPATION: works at a desk  PLOF: Independent  PATIENT GOALS: improve ability to navigate stairs, run, speed walking, jump, and workout with reduced pain/limitation  NEXT MD VISIT: February 2025  OBJECTIVE:  Note: Objective measures were completed at Evaluation unless otherwise noted.  DIAGNOSTIC FINDINGS:   PATIENT SURVEYS:  FOTO 64 (12/21/22)  COGNITION: Overall cognitive status: Within functional limits for tasks assessed     SENSATION: [facility]  MUSCLE LENGTH: Hamstrings: Right WFL ; Left WFL    POSTURE: min R knee valgus and mild ankle overpronation (R>L). Bil knee hyperextension.   PALPATION: TTP bil medial cuneiform/ navicular and bil lateral knee joint line  LOWER EXTREMITY ROM: Hip ROM: WFL ,  Knee ROM: WFL hypermobile extension bilaterally,  Ankle ROM: WFL, hypermobile inversion  LOWER EXTREMITY MMT:  MMT Right eval Left eval  Hip flexion    Hip extension 5 4+  Hip abduction 5 5  Hip adduction    Hip internal rotation    Hip external rotation     Knee flexion 4 4  Knee extension 4 4  Ankle dorsiflexion    Ankle plantarflexion    Ankle inversion    Ankle eversion     (Blank rows = not tested)  LOWER EXTREMITY SPECIAL TESTS:    FUNCTIONAL TESTS:  Single Leg hop Test:  R- Pt able to hop for 10 secs before stopping d/t pain in foot.  L- Pt able to hop for 8 secs before stopping d/t pain in L knee   GAIT: Distance walked: 25 ft Assistive device utilized: None Level of assistance: Complete Independence Comments: R dynamic knee valgus and overpronation of R foot   TODAY'S TREATMENT:                                                                                                                              DATE: 01/04/2023   Therapeutic Exercise: prone: hamstring curls 5x5, hip ext 3x5 B Supine: LTR 3 minutes slow controlled, quad sets x15 5" holds B, review of entire HEP Seated:  Standing:  Stretches:  Neuromuscular Re-education: Manual Therapy: Therapeutic Activity: Self Care: on strategies on itching management with ice/oatmeal bath and calamine lotion. Avoiding alcohol based lotions   Modalities: cryotherapy to thighs and back - for reduced itching    PATIENT EDUCATION:  Education: on anti-itching interventions Person educated: Patient Education method: Explanation, Demonstration, and Verbal cues Education comprehension: verbalized understanding and returned demonstration  HOME EXERCISE PROGRAM: [id number]  ASSESSMENT:  CLINICAL IMPRESSION: 01/04/2023  Educated patient in strategies to help with itching secondary to rash over legs/stomach and arms. Tolerated cryotherapy well. Progressed exercises with focus on muscle activation for improve proprioceptive awareness. Tolerated well. Will continue with current PCO as tolerated.    Eval: Patient is a 27 y.o.f who was seen today for physical therapy evaluation and treatment for bil foot and knee pain. Primary impairments include hypermobility of Bil knees  and feet/ankles and muscular performance deficits of bil knee & feet/ankles. She also has bilateral shoulder pain, plan to give pt HEP for this in future sessions, but will focus on knee and ankle pain at this time. These impairments limit her ability to run, speed walk, navigate stairs, jump, and workout without pain/limitation. Pt will benefit from skilled physical therapy to work on these impairments.  OBJECTIVE IMPAIRMENTS: decreased activity tolerance, decreased coordination, decreased endurance, decreased strength, and  pain.   ACTIVITY LIMITATIONS: carrying, lifting, bending, standing, squatting, stairs, and locomotion level  PARTICIPATION LIMITATIONS:  working out with Systems analyst  PERSONAL FACTORS: Past/current experiences are also affecting patient's functional outcome.   REHAB POTENTIAL: Good  CLINICAL DECISION MAKING: Stable/uncomplicated  EVALUATION COMPLEXITY: Low   GOALS: Goals reviewed with patient? Yes  SHORT TERM GOALS: Target date: 01/04/2023  Pt will be independent in initial HEP Goal status: INITIAL  2.  Pt will report ability to speed walk with less than 3/10 pain.   Goal status: INITIAL     LONG TERM GOALS: Target date: 02/15/2023  Pt will report 75% improvement in the ability to navigate stairs.  Goal status: INITIAL  2.  Pt will report ability to run for 10 minutes with <3/10 pain in her knees & feet.  Goal status: INITIAL  3.  Pt will report 75% improvement in the her knee and ankle pain with LE workout   Goal status: INITIAL  4.   Pt will demo ability to perform 20 seconds of single leg hops to promote ability to return to running.   Goal status: INITIAL     PLAN:  PT FREQUENCY: 1-2x/week  PT DURATION: 8 weeks  PLANNED INTERVENTIONS: 97110-Therapeutic exercises, 97530- Therapeutic activity, 97112- Neuromuscular re-education, 97535- Self Care, [id number]- Manual therapy, [phone]- Ionotophoresis 4mg /ml Dexamethasone, Patient/Family  education, Balance training, Stair training, Taping, Dry Needling, Joint mobilization, Joint manipulation, Spinal manipulation, Spinal mobilization, Cryotherapy, and Moist heat  PLAN FOR NEXT SESSION: LE strengthening, active vs passive posture reviewed and educated - patient likes verbal/tactile cues and to know where she should feel it (needs additional cues to avoid compensatory strategies).  2:33 PM, 01/04/23 Tereasa Coop, DPT Physical Therapy with [facility]

## 2023-01-06 ENCOUNTER — Encounter: Payer: Self-pay | Admitting: Physical Therapy

## 2023-01-06 ENCOUNTER — Ambulatory Visit: Payer: 59 | Admitting: Physical Therapy

## 2023-01-06 DIAGNOSIS — M25571 Pain in right ankle and joints of right foot: Secondary | ICD-10-CM | POA: Diagnosis not present

## 2023-01-06 DIAGNOSIS — M25561 Pain in right knee: Secondary | ICD-10-CM

## 2023-01-06 DIAGNOSIS — G8929 Other chronic pain: Secondary | ICD-10-CM

## 2023-01-06 DIAGNOSIS — M6281 Muscle weakness (generalized): Secondary | ICD-10-CM

## 2023-01-06 DIAGNOSIS — M25572 Pain in left ankle and joints of left foot: Secondary | ICD-10-CM | POA: Diagnosis not present

## 2023-01-06 DIAGNOSIS — M25562 Pain in left knee: Secondary | ICD-10-CM

## 2023-01-06 NOTE — Therapy (Signed)
OUTPATIENT PHYSICAL THERAPY LOWER EXTREMITY Treatment   Patient Name: Cantrece Linhares MRN: [id number] DOB:Jul 03, 1995, 27 y.o., female Today's Date: 01/06/2023  END OF SESSION:  PT End of Session - 01/06/23 1519     Visit Number 4    Number of Visits 16    Date for PT Re-Evaluation 02/15/23    Authorization Type [employer]    PT Start Time 1520    PT Stop Time 1600    PT Time Calculation (min) 40 min    Activity Tolerance Patient tolerated treatment well;No increased pain    Behavior During Therapy WFL for tasks assessed/performed              Past Medical History:  Diagnosis Date   Allergy    Sesonal   Asthma    as a young child   Obesity    Seizures (HCC)    febrile age of 2. Stare spell- last one at 59 when menes begin   Past Surgical History:  Procedure Laterality Date   ANTERIOR CRUCIATE LIGAMENT REPAIR Right 08/29/2012   Procedure: RIGHT KNEE ANTERIOR CRUCIATE LIGAMENT RECONSTRUCTION, HAMSTRING AUTOGRAFT;  Surgeon: Cammy Copa, MD;  Location: [facility] OR;  Service: Orthopedics;  Laterality: Right;   There are no problems to display for this patient.   PCP: Fleet Contras, MD  REFERRING PROVIDER: Ozella Rocks, MD  REFERRING DIAG: Bilateral knee, foot, and shoulder pain  THERAPY DIAG:  Hypermobility of bilateral knees and ankle/feet. Muscular performance deficits of Bil Les.  Rationale for Evaluation and Treatment: Rehabilitation  ONSET DATE: Over a year ago  SUBJECTIVE:   SUBJECTIVE STATEMENT: 01/06/2023 States her rash has not gone away. Has seen MD this week and seeing again tomorrow. Reports knee pain a bit better since she has not been locking out her knees.   Eval: Pt states from January 2023 until now, she lost > 100 lbs and has developed increasing pain in her knees, feet, and bilateral shoulders. Pt's foot pain started a few years ago, knee pain within the last year, and shoulder pain within last of weeks. Pt states her  MD told her a probable cause was the changing of her body mechanics secondary to weight loss. Aggravating factors for her knees & feet include, stairs, jumping, running, working out, & speed walking. Alleviating factors include, Voltaren, ice, heat or Ibuprofen. Pt states she does experience N/T in her feet, but believes it may be caused by the new compression sleeves her MD told her to use. Pt had L ACL surgery 10 years ago.   PERTINENT HISTORY: Acl surgery 2014,   PAIN:  Are you having pain? Yes: NPRS scale: 0/10 Pain location: Bil knees R>L.  Pain description: sore Aggravating factors: standing, walking, stairs,  Relieving factors: none stated       PRECAUTIONS: None  RED FLAGS: None   WEIGHT BEARING RESTRICTIONS: No  FALLS:  Has patient fallen in last 6 months? No  LIVING ENVIRONMENT: Lives with: lives with their family Lives in: House/apartment Stairs: Yes: External: 2 floors steps; can reach both Has following equipment at home: None  OCCUPATION: works at a desk  PLOF: Independent  PATIENT GOALS: improve ability to navigate stairs, run, speed walking, jump, and workout with reduced pain/limitation  NEXT MD VISIT: February 2025  OBJECTIVE:  Note: Objective measures were completed at Evaluation unless otherwise noted.  DIAGNOSTIC FINDINGS:   PATIENT SURVEYS:  FOTO 64 (12/21/22)  COGNITION: Overall cognitive status: Within functional limits for tasks  assessed     SENSATION: WFL   MUSCLE LENGTH: Hamstrings: Right WFL ; Left WFL    POSTURE: min R knee valgus and mild ankle overpronation (R>L). Bil knee hyperextension.   PALPATION: TTP bil medial cuneiform/ navicular and bil lateral knee joint line  LOWER EXTREMITY ROM: Hip ROM: WFL ,  Knee ROM: WFL hypermobile extension bilaterally,  Ankle ROM: WFL, hypermobile inversion  LOWER EXTREMITY MMT:  MMT Right eval Left eval  Hip flexion    Hip extension 5 4+  Hip abduction 5 5  Hip adduction     Hip internal rotation    Hip external rotation    Knee flexion 4 4  Knee extension 4 4  Ankle dorsiflexion    Ankle plantarflexion    Ankle inversion    Ankle eversion     (Blank rows = not tested)  LOWER EXTREMITY SPECIAL TESTS:    FUNCTIONAL TESTS:  Single Leg hop Test:  R- Pt able to hop for 10 secs before stopping d/t pain in foot.  L- Pt able to hop for 8 secs before stopping d/t pain in L knee   GAIT: Distance walked: 25 ft Assistive device utilized: None Level of assistance: Complete Independence Comments: R dynamic knee valgus and overpronation of R foot   TODAY'S TREATMENT:                                                                                                                              DATE:  01/06/2023 Therapeutic Exercise: prone:   Supine: bridging 2 x 10;   LTR 2 minutes slow controlled,  SLR 2 x 10 bil;  S/L: hip abd 2 x10 bil;  Seated:  LAQ 3 lb 2 x 10 bil;   Sit to stand 2 x 10 with education on LE and back mechanics Standing:Squat to table x 10;  step ups 6 in fwd, no UE support, x 12 bil with cuing for form.  Stretches:  Neuromuscular Re-education: Manual Therapy: Therapeutic Activity: Self Care:      PATIENT EDUCATION:  Education: on anti-itching interventions Person educated: Patient Education method: Explanation, Demonstration, and Verbal cues Education comprehension: verbalized understanding and returned demonstration  HOME EXERCISE PROGRAM: [id number]  ASSESSMENT:  CLINICAL IMPRESSION:  01/06/2023 Pt able to progress hip and quad strength. She is challenged with exercises due to weakness, but no increased pain in knees. Mild discomfort and increased difficulty on R>L with step up, but improved with cueing for more pressure through heel vs toe. Plan to progress strength as tolerated.    Eval: Patient is a 27 y.o.f who was seen today for physical therapy evaluation and treatment for bil foot and knee pain. Primary impairments  include hypermobility of Bil knees and feet/ankles and muscular performance deficits of bil knee & feet/ankles. She also has bilateral shoulder pain, plan to give pt HEP for this in future sessions, but will focus on knee and ankle pain at this time.  These impairments limit her ability to run, speed walk, navigate stairs, jump, and workout without pain/limitation. Pt will benefit from skilled physical therapy to work on these impairments.  OBJECTIVE IMPAIRMENTS: decreased activity tolerance, decreased coordination, decreased endurance, decreased strength, and pain.   ACTIVITY LIMITATIONS: carrying, lifting, bending, standing, squatting, stairs, and locomotion level  PARTICIPATION LIMITATIONS:  working out with Systems analyst  PERSONAL FACTORS: Past/current experiences are also affecting patient's functional outcome.   REHAB POTENTIAL: Good  CLINICAL DECISION MAKING: Stable/uncomplicated  EVALUATION COMPLEXITY: Low   GOALS: Goals reviewed with patient? Yes  SHORT TERM GOALS: Target date: 01/04/2023  Pt will be independent in initial HEP Goal status: INITIAL  2.  Pt will report ability to speed walk with less than 3/10 pain.   Goal status: INITIAL     LONG TERM GOALS: Target date: 02/15/2023  Pt will report 75% improvement in the ability to navigate stairs.  Goal status: INITIAL  2.  Pt will report ability to run for 10 minutes with <3/10 pain in her knees & feet.  Goal status: INITIAL  3.  Pt will report 75% improvement in the her knee and ankle pain with LE workout   Goal status: INITIAL  4.   Pt will demo ability to perform 20 seconds of single leg hops to promote ability to return to running.   Goal status: INITIAL     PLAN:  PT FREQUENCY: 1-2x/week  PT DURATION: 8 weeks  PLANNED INTERVENTIONS: 97110-Therapeutic exercises, 97530- Therapeutic activity, 97112- Neuromuscular re-education, 97535- Self Care, 16109- Manual therapy, [phone]- Ionotophoresis 4mg /ml  Dexamethasone, Patient/Family education, Balance training, Stair training, Taping, Dry Needling, Joint mobilization, Joint manipulation, Spinal manipulation, Spinal mobilization, Cryotherapy, and Moist heat  PLAN FOR NEXT SESSION: LE strengthening, active vs passive posture reviewed and educated - patient likes verbal/tactile cues and to know where she should feel it (needs additional cues to avoid compensatory strategies).  Sedalia Muta, PT, DPT 9:10 AM  01/07/23

## 2023-01-10 ENCOUNTER — Ambulatory Visit (INDEPENDENT_AMBULATORY_CARE_PROVIDER_SITE_OTHER): Payer: 59 | Admitting: Physical Therapy

## 2023-01-10 ENCOUNTER — Encounter: Payer: Self-pay | Admitting: Physical Therapy

## 2023-01-10 DIAGNOSIS — M25572 Pain in left ankle and joints of left foot: Secondary | ICD-10-CM | POA: Diagnosis not present

## 2023-01-10 DIAGNOSIS — G8929 Other chronic pain: Secondary | ICD-10-CM

## 2023-01-10 DIAGNOSIS — M25561 Pain in right knee: Secondary | ICD-10-CM

## 2023-01-10 DIAGNOSIS — M25571 Pain in right ankle and joints of right foot: Secondary | ICD-10-CM | POA: Diagnosis not present

## 2023-01-10 DIAGNOSIS — M6281 Muscle weakness (generalized): Secondary | ICD-10-CM | POA: Diagnosis not present

## 2023-01-10 DIAGNOSIS — M25562 Pain in left knee: Secondary | ICD-10-CM

## 2023-01-10 NOTE — Therapy (Signed)
OUTPATIENT PHYSICAL THERAPY LOWER EXTREMITY Treatment   Patient Name: Hailey Patrick MRN: [id number] DOB:12/13/1995, 27 y.o., female Today's Date: 01/10/2023   END OF SESSION:  PT End of Session - 01/10/23 1521     Visit Number 5    Number of Visits 16    Date for PT Re-Evaluation 02/15/23    Authorization Type [employer]    PT Start Time 1520    PT Stop Time 1600    PT Time Calculation (min) 40 min    Activity Tolerance Patient tolerated treatment well;No increased pain    Behavior During Therapy WFL for tasks assessed/performed               Past Medical History:  Diagnosis Date   Allergy    Sesonal   Asthma    as a young child   Obesity    Seizures (HCC)    febrile age of 2. Stare spell- last one at 15 when menes begin   Past Surgical History:  Procedure Laterality Date   ANTERIOR CRUCIATE LIGAMENT REPAIR Right 08/29/2012   Procedure: RIGHT KNEE ANTERIOR CRUCIATE LIGAMENT RECONSTRUCTION, HAMSTRING AUTOGRAFT;  Surgeon: Cammy Copa, MD;  Location: [facility] OR;  Service: Orthopedics;  Laterality: Right;   There are no problems to display for this patient.   PCP: Fleet Contras, MD  REFERRING PROVIDER: Ozella Rocks, MD  REFERRING DIAG: Bilateral knee, foot, and shoulder pain  THERAPY DIAG:  Hypermobility of bilateral knees and ankle/feet. Muscular performance deficits of Bil Les.  Rationale for Evaluation and Treatment: Rehabilitation  ONSET DATE: Over a year ago  SUBJECTIVE:   SUBJECTIVE STATEMENT: 01/10/2023 Pt states she [place] [employer] [place] this [place] and spent "a lot of time in the car and walking" which caused her knees (R>L) to be sore. Pt reports she has noticed less pain navigating up stairs after less session.    Eval: Pt states from January 2023 until now, she lost > 100 lbs and has developed increasing pain in her knees, feet, and bilateral shoulders. Pt's foot pain started a few years ago, knee pain  within the last year, and shoulder pain within last of weeks. Pt states her MD told her a probable cause was the changing of her body mechanics secondary to weight loss. Aggravating factors for her knees & feet include, stairs, jumping, running, working out, & speed walking. Alleviating factors include, Voltaren, ice, heat or Ibuprofen. Pt states she does experience N/T in her feet, but believes it may be caused by the new compression sleeves her MD told her to use. Pt had L ACL surgery 10 years ago.   PERTINENT HISTORY: Acl surgery 2014,   PAIN:  Are you having pain? Yes: NPRS scale: 0/10 Pain location: Bil knees R>L.  Pain description: sore Aggravating factors: standing, walking, stairs,  Relieving factors: none stated       PRECAUTIONS: None  RED FLAGS: None   WEIGHT BEARING RESTRICTIONS: No  FALLS:  Has patient fallen in last 6 months? No  LIVING ENVIRONMENT: Lives with: lives with their family Lives in: House/apartment Stairs: Yes: External: 2 floors steps; can reach both Has following equipment at home: None  OCCUPATION: works at a desk  PLOF: Independent  PATIENT GOALS: improve ability to navigate stairs, run, speed walking, jump, and workout with reduced pain/limitation  NEXT MD VISIT: February 2025  OBJECTIVE:  Note: Objective measures were completed at Evaluation unless otherwise noted.  DIAGNOSTIC FINDINGS:   PATIENT SURVEYS:  FOTO 64 (12/21/22)  COGNITION: Overall cognitive status: Within functional limits for tasks assessed     SENSATION: WFL   MUSCLE LENGTH: Hamstrings: Right WFL ; Left WFL    POSTURE: min R knee valgus and mild ankle overpronation (R>L). Bil knee hyperextension.   PALPATION: TTP bil medial cuneiform/ navicular and bil lateral knee joint line  LOWER EXTREMITY ROM: Hip ROM: WFL ,  Knee ROM: WFL hypermobile extension bilaterally,  Ankle ROM: WFL, hypermobile inversion  LOWER EXTREMITY MMT:  MMT Right eval Left eval   Hip flexion    Hip extension 5 4+  Hip abduction 5 5  Hip adduction    Hip internal rotation    Hip external rotation    Knee flexion 4 4  Knee extension 4 4  Ankle dorsiflexion    Ankle plantarflexion    Ankle inversion    Ankle eversion     (Blank rows = not tested)  LOWER EXTREMITY SPECIAL TESTS:    FUNCTIONAL TESTS:  Single Leg hop Test:  R- Pt able to hop for 10 secs before stopping d/t pain in foot.  L- Pt able to hop for 8 secs before stopping d/t pain in L knee   GAIT: Distance walked: 25 ft Assistive device utilized: None Level of assistance: Complete Independence Comments: R dynamic knee valgus and overpronation of R foot   TODAY'S TREATMENT:                                                                                                                              DATE:  01/10/2023 Therapeutic Exercise: Prone:   Supine: Bridging 2 x 10;  SLR 2 x 12 bil;  S/L: hip abd 2 x15 bil;  Seated:  LAQ 3 lb 2 x 15 bil;  Standing: Squats with dowel x 10 with education on back mechanics and core re;  Step ups 6 in fwd, no UE support, x 12 bil with cuing for form; Squats with 15# KB x12; Eccentric step downs bil x10 Stretches:  Neuromuscular Re-education: Manual Therapy: Therapeutic Activity: Self Care:     Previous Therapeutic Exercise: prone:   Supine: bridging 2 x 10;   LTR 2 minutes slow controlled,  SLR 2 x 10 bil;  S/L: hip abd 2 x10 bil;  Seated:  LAQ 3 lb 2 x 10 bil;   Sit to stand 2 x 10 with education on LE and back mechanics Standing:Squat to table x 10;  step ups 6 in fwd, no UE support, x 12 bil with cuing for form.  Stretches:  Neuromuscular Re-education: Manual Therapy: Therapeutic Activity: Self Care:      PATIENT EDUCATION:  Education: on anti-itching interventions Person educated: Patient Education method: Explanation, Demonstration, and Verbal cues Education comprehension: verbalized understanding and returned demonstration  HOME  EXERCISE PROGRAM: [id number]  ASSESSMENT:  CLINICAL IMPRESSION:  01/10/2023 Session focused on ther ex to promote global LE strengthening and NMRE for bodily mechanics and proper  LE activation. Pt able to progress multiple interventions by increasing reps completed. Pt continues to have some difficulty with eccentric loading in deeper knee flexion ROM secondary to pain (R>L), LE and core strength, and body positioning- although steady progress is being made. Pt showing improvements with body mechanics during dynamic tasks. Continue to progress NMRE and strength as tolerated.     Eval: Patient is a 27 y.o.f who was seen today for physical therapy evaluation and treatment for bil foot and knee pain. Primary impairments include hypermobility of Bil knees and feet/ankles and muscular performance deficits of bil knee & feet/ankles. She also has bilateral shoulder pain, plan to give pt HEP for this in future sessions, but will focus on knee and ankle pain at this time. These impairments limit her ability to run, speed walk, navigate stairs, jump, and workout without pain/limitation. Pt will benefit from skilled physical therapy to work on these impairments.  OBJECTIVE IMPAIRMENTS: decreased activity tolerance, decreased coordination, decreased endurance, decreased strength, and pain.   ACTIVITY LIMITATIONS: carrying, lifting, bending, standing, squatting, stairs, and locomotion level  PARTICIPATION LIMITATIONS: working out with Systems analyst, exercises such as pushups (pain in wrists), planks (pain in wrists), pain in knees- KB swings, squats, & jumping jacks   PERSONAL FACTORS: Past/current experiences are also affecting patient's functional outcome.   REHAB POTENTIAL: Good  CLINICAL DECISION MAKING: Stable/uncomplicated  EVALUATION COMPLEXITY: Low   GOALS: Goals reviewed with patient? Yes  SHORT TERM GOALS: Target date: 01/04/2023  Pt will be independent in initial HEP Goal status:  MET  2.  Pt will report ability to speed walk with less than 3/10 pain.   Goal status: INITIAL     LONG TERM GOALS: Target date: 02/15/2023  Pt will report 75% improvement in the ability to navigate stairs.  Goal status: INITIAL  2.  Pt will report ability to run for 10 minutes with <3/10 pain in her knees & feet.  Goal status: INITIAL  3.  Pt will report 75% improvement in the her knee and ankle pain with LE workout   Goal status: INITIAL  4.   Pt will demo ability to perform 20 seconds of single leg hops to promote ability to return to running.   Goal status: INITIAL     PLAN:  PT FREQUENCY: 1-2x/week  PT DURATION: 8 weeks  PLANNED INTERVENTIONS: 97110-Therapeutic exercises, 97530- Therapeutic activity, 97112- Neuromuscular re-education, 97535- Self Care, 40981- Manual therapy, [phone]- Ionotophoresis 4mg /ml Dexamethasone, Patient/Family education, Balance training, Stair training, Taping, Dry Needling, Joint mobilization, Joint manipulation, Spinal manipulation, Spinal mobilization, Cryotherapy, and Moist heat  PLAN FOR NEXT SESSION: Look to add additional core re and strengthening interventions - patient likes verbal/tactile cues and to know where she should feel it (needs additional cues to avoid compensatory strategies).   [facility] [place], SPT   This entire session was performed under direct supervision and direction of a licensed Estate agent . I have personally read, edited and approve of the note as written.  Sedalia Muta, PT, DPT 4:27 PM  01/10/23

## 2023-01-12 ENCOUNTER — Ambulatory Visit: Payer: 59 | Admitting: Physical Therapy

## 2023-01-12 ENCOUNTER — Encounter: Payer: Self-pay | Admitting: Physical Therapy

## 2023-01-12 DIAGNOSIS — M25572 Pain in left ankle and joints of left foot: Secondary | ICD-10-CM | POA: Diagnosis not present

## 2023-01-12 DIAGNOSIS — M6281 Muscle weakness (generalized): Secondary | ICD-10-CM | POA: Diagnosis not present

## 2023-01-12 DIAGNOSIS — M25562 Pain in left knee: Secondary | ICD-10-CM | POA: Diagnosis not present

## 2023-01-12 DIAGNOSIS — G8929 Other chronic pain: Secondary | ICD-10-CM

## 2023-01-12 DIAGNOSIS — M25561 Pain in right knee: Secondary | ICD-10-CM

## 2023-01-12 NOTE — Therapy (Cosign Needed)
OUTPATIENT PHYSICAL THERAPY LOWER EXTREMITY Treatment   Patient Name: Hailey Patrick MRN: [id number] DOB:1995/05/01, 27 y.o., female Today's Date: 01/12/2023   END OF SESSION:  PT End of Session - 01/12/23 1307     Visit Number 6    Number of Visits 16    Date for PT Re-Evaluation 02/15/23    Authorization Type [employer]    PT Start Time 1305    PT Stop Time 1345    PT Time Calculation (min) 40 min    Activity Tolerance Patient tolerated treatment well;No increased pain    Behavior During Therapy WFL for tasks assessed/performed                Past Medical History:  Diagnosis Date   Allergy    Sesonal   Asthma    as a young child   Obesity    Seizures (HCC)    febrile age of 2. Stare spell- last one at 72 when menes begin   Past Surgical History:  Procedure Laterality Date   ANTERIOR CRUCIATE LIGAMENT REPAIR Right 08/29/2012   Procedure: RIGHT KNEE ANTERIOR CRUCIATE LIGAMENT RECONSTRUCTION, HAMSTRING AUTOGRAFT;  Surgeon: Cammy Copa, MD;  Location: [facility] OR;  Service: Orthopedics;  Laterality: Right;   There are no problems to display for this patient.   PCP: Fleet Contras, MD  REFERRING PROVIDER: Ozella Rocks, MD  REFERRING DIAG: Bilateral knee, foot, and shoulder pain  THERAPY DIAG:  Hypermobility of bilateral knees and ankle/feet. Muscular performance deficits of Bil Les.  Rationale for Evaluation and Treatment: Rehabilitation  ONSET DATE: Over a year ago  SUBJECTIVE:   SUBJECTIVE STATEMENT: 01/12/2023 Pt states after last session she had some increased soreness in her thighs and glutes. Reports no soreness today.    Eval: Pt states from January 2023 until now, she lost > 100 lbs and has developed increasing pain in her knees, feet, and bilateral shoulders. Pt's foot pain started a few years ago, knee pain within the last year, and shoulder pain within last of weeks. Pt states her MD told her a probable cause was the  changing of her body mechanics secondary to weight loss. Aggravating factors for her knees & feet include, stairs, jumping, running, working out, & speed walking. Alleviating factors include, Voltaren, ice, heat or Ibuprofen. Pt states she does experience N/T in her feet, but believes it may be caused by the new compression sleeves her MD told her to use. Pt had L ACL surgery 10 years ago.   PERTINENT HISTORY: Acl surgery 2014,   PAIN:  Are you having pain? Yes: NPRS scale: 0/10 Pain location: Bil knees R>L.  Pain description: sore Aggravating factors: standing, walking, stairs,  Relieving factors: none stated       PRECAUTIONS: None  RED FLAGS: None   WEIGHT BEARING RESTRICTIONS: No  FALLS:  Has patient fallen in last 6 months? No  LIVING ENVIRONMENT: Lives with: lives with their family Lives in: House/apartment Stairs: Yes: External: 2 floors steps; can reach both Has following equipment at home: None  OCCUPATION: works at a desk  PLOF: Independent  PATIENT GOALS: improve ability to navigate stairs, run, speed walking, jump, and workout with reduced pain/limitation  NEXT MD VISIT: February 2025  OBJECTIVE:  Note: Objective measures were completed at Evaluation unless otherwise noted.  DIAGNOSTIC FINDINGS:   PATIENT SURVEYS:  FOTO 64 (12/21/22)  COGNITION: Overall cognitive status: Within functional limits for tasks assessed     SENSATION: [facility]  MUSCLE LENGTH: Hamstrings: Right WFL ; Left WFL    POSTURE: min R knee valgus and mild ankle overpronation (R>L). Bil knee hyperextension.   PALPATION: TTP bil medial cuneiform/ navicular and bil lateral knee joint line  LOWER EXTREMITY ROM: Hip ROM: WFL ,  Knee ROM: WFL hypermobile extension bilaterally,  Ankle ROM: WFL, hypermobile inversion  LOWER EXTREMITY MMT:  MMT Right eval Left eval  Hip flexion    Hip extension 5 4+  Hip abduction 5 5  Hip adduction    Hip internal rotation    Hip external  rotation    Knee flexion 4 4  Knee extension 4 4  Ankle dorsiflexion    Ankle plantarflexion    Ankle inversion    Ankle eversion     (Blank rows = not tested)  LOWER EXTREMITY SPECIAL TESTS:    FUNCTIONAL TESTS:  Single Leg hop Test:  R- Pt able to hop for 10 secs before stopping d/t pain in foot.  L- Pt able to hop for 8 secs before stopping d/t pain in L knee   GAIT: Distance walked: 25 ft Assistive device utilized: None Level of assistance: Complete Independence Comments: R dynamic knee valgus and overpronation of R foot   TODAY'S TREATMENT:                                                                                                                              DATE:  01/12/2023 Therapeutic Exercise: Prone:   Supine: Bridging 2 x 15;  SLR 2 x 12 bil; Heel Taps 2x8;  S/L: Hip abd 2 x15 bil;  Seated:  LAQ 3 lb 2 x 15 bil;  Standing:  Squats with 15# KB x12; Pallof Press Bil x12 GTB; Calf Raises 2x15 Stretches:  Neuromuscular Re-education: Manual Therapy: Therapeutic Activity: Self Care:     Previous Therapeutic Exercise: Prone:   Supine: Bridging 2 x 10;  SLR 2 x 12 bil;  S/L: hip abd 2 x15 bil;  Seated:  LAQ 3 lb 2 x 15 bil;  Standing: Squats with dowel x 10 with education on back mechanics and core re;  Step ups 6 in fwd, no UE support, x 12 bil with cuing for form; Squats with 15# KB x12; Eccentric step downs bil x10 Stretches:  Neuromuscular Re-education: Manual Therapy: Therapeutic Activity: Self Care:   Therapeutic Exercise: prone:   Supine: bridging 2 x 10;   LTR 2 minutes slow controlled,  SLR 2 x 10 bil;  S/L: hip abd 2 x10 bil;  Seated:  LAQ 3 lb 2 x 10 bil;   Sit to stand 2 x 10 with education on LE and back mechanics Standing:Squat to table x 10;  step ups 6 in fwd, no UE support, x 12 bil with cuing for form.  Stretches:  Neuromuscular Re-education: Manual Therapy: Therapeutic Activity: Self Care:      PATIENT EDUCATION:   Education: on anti-itching interventions Person educated: Patient Education  method: Explanation, Demonstration, and Verbal cues Education comprehension: verbalized understanding and returned demonstration  HOME EXERCISE PROGRAM: [id number]  ASSESSMENT:  CLINICAL IMPRESSION:  01/12/2023 Session focused on ther ex for LE strengthening and NMRE to promote core re-ed for standing/dynamic tasks. Pt continues to progress LE interventions by increasing the number of reps in multiple interventions. Pt able to perform core re-ed interventions well but required multiple breaks during supine heel taps secondary to muscular fatigue- although she is making steady progress. Plan to include additional weight-bearing quad/LE strengthening that promote return to functional activities in the gym.       Eval: Patient is a 27 y.o.f who was seen today for physical therapy evaluation and treatment for bil foot and knee pain. Primary impairments include hypermobility of Bil knees and feet/ankles and muscular performance deficits of bil knee & feet/ankles. She also has bilateral shoulder pain, plan to give pt HEP for this in future sessions, but will focus on knee and ankle pain at this time. These impairments limit her ability to run, speed walk, navigate stairs, jump, and workout without pain/limitation. Pt will benefit from skilled physical therapy to work on these impairments.  OBJECTIVE IMPAIRMENTS: decreased activity tolerance, decreased coordination, decreased endurance, decreased strength, and pain.   ACTIVITY LIMITATIONS: carrying, lifting, bending, standing, squatting, stairs, and locomotion level  PARTICIPATION LIMITATIONS: working out with Systems analyst, exercises such as pushups (pain in wrists), planks (pain in wrists), pain in knees- KB swings, squats, & jumping jacks   PERSONAL FACTORS: Past/current experiences are also affecting patient's functional outcome.   REHAB POTENTIAL:  Good  CLINICAL DECISION MAKING: Stable/uncomplicated  EVALUATION COMPLEXITY: Low   GOALS: Goals reviewed with patient? Yes  SHORT TERM GOALS: Target date: 01/04/2023  Pt will be independent in initial HEP Goal status: MET  2.  Pt will report ability to speed walk with less than 3/10 pain.   Goal status: INITIAL     LONG TERM GOALS: Target date: 02/15/2023  Pt will report 75% improvement in the ability to navigate stairs.  Goal status: INITIAL  2.  Pt will report ability to run for 10 minutes with <3/10 pain in her knees & feet.  Goal status: INITIAL  3.  Pt will report 75% improvement in the her knee and ankle pain with LE workout   Goal status: INITIAL  4.   Pt will demo ability to perform 20 seconds of single leg hops to promote ability to return to running.   Goal status: INITIAL     PLAN:  PT FREQUENCY: 1-2x/week  PT DURATION: 8 weeks  PLANNED INTERVENTIONS: 97110-Therapeutic exercises, 97530- Therapeutic activity, 97112- Neuromuscular re-education, 97535- Self Care, 47829- Manual therapy, [phone]- Ionotophoresis 4mg /ml Dexamethasone, Patient/Family education, Balance training, Stair training, Taping, Dry Needling, Joint mobilization, Joint manipulation, Spinal manipulation, Spinal mobilization, Cryotherapy, and Moist heat  PLAN FOR NEXT SESSION: Look to add additional core re and strengthening interventions - patient likes verbal/tactile cues and to know where she should feel it (needs additional cues to avoid compensatory strategies).   [facility], SPT   This entire session was performed under direct supervision and direction of a licensed Estate agent. I have personally read, edited and approve of the note as written.  Sedalia Muta, PT, DPT 1:13 PM  01/12/23

## 2023-01-17 ENCOUNTER — Ambulatory Visit (INDEPENDENT_AMBULATORY_CARE_PROVIDER_SITE_OTHER): Payer: 59 | Admitting: Physical Therapy

## 2023-01-17 DIAGNOSIS — M25571 Pain in right ankle and joints of right foot: Secondary | ICD-10-CM

## 2023-01-17 DIAGNOSIS — M25562 Pain in left knee: Secondary | ICD-10-CM

## 2023-01-17 DIAGNOSIS — M6281 Muscle weakness (generalized): Secondary | ICD-10-CM

## 2023-01-17 DIAGNOSIS — G8929 Other chronic pain: Secondary | ICD-10-CM

## 2023-01-17 DIAGNOSIS — M25561 Pain in right knee: Secondary | ICD-10-CM

## 2023-01-17 DIAGNOSIS — M25572 Pain in left ankle and joints of left foot: Secondary | ICD-10-CM | POA: Diagnosis not present

## 2023-01-17 NOTE — Therapy (Unsigned)
OUTPATIENT PHYSICAL THERAPY LOWER EXTREMITY Treatment   Patient Name: Hailey Patrick MRN: [id number] DOB:[birth year], 27 y.o., female Today's Date: 01/17/2023   END OF SESSION:  PT End of Session - 01/17/23 1519     Visit Number 7    Number of Visits 16    Date for PT Re-Evaluation 02/15/23    Authorization Type [employer]    PT Start Time 1518    PT Stop Time 1558    PT Time Calculation (min) 40 min    Activity Tolerance Patient tolerated treatment well;No increased pain    Behavior During Therapy WFL for tasks assessed/performed                 Past Medical History:  Diagnosis Date   Allergy    Sesonal   Asthma    as a young child   Obesity    Seizures (HCC)    febrile age of 2. Stare spell- last one at 83 when menes begin   Past Surgical History:  Procedure Laterality Date   ANTERIOR CRUCIATE LIGAMENT REPAIR Right 08/29/2012   Procedure: RIGHT KNEE ANTERIOR CRUCIATE LIGAMENT RECONSTRUCTION, HAMSTRING AUTOGRAFT;  Surgeon: Cammy Copa, MD;  Location: [facility] OR;  Service: Orthopedics;  Laterality: Right;   There are no problems to display for this patient.   PCP: Fleet Contras, MD  REFERRING PROVIDER: Ozella Rocks, MD  REFERRING DIAG: Bilateral knee, foot, and shoulder pain  THERAPY DIAG:  Hypermobility of bilateral knees and ankle/feet. Muscular performance deficits of Bil Les.  Rationale for Evaluation and Treatment: Rehabilitation  ONSET DATE: Over a year ago  SUBJECTIVE:   SUBJECTIVE STATEMENT: 01/17/2023 Pt states she has had back pain for the past 2 days that began insidiously. She needed to use a heating pad and pain meds     Eval: Pt states from January 2023 until now, she lost > 100 lbs and has developed increasing pain in her knees, feet, and bilateral shoulders. Pt's foot pain started a few years ago, knee pain within the last year, and shoulder pain within last of weeks. Pt states her MD told her a probable cause  was the changing of her body mechanics secondary to weight loss. Aggravating factors for her knees & feet include, stairs, jumping, running, working out, & speed walking. Alleviating factors include, Voltaren, ice, heat or Ibuprofen. Pt states she does experience N/T in her feet, but believes it may be caused by the new compression sleeves her MD told her to use. Pt had L ACL surgery 10 years ago.   PERTINENT HISTORY: Acl surgery 2014,   PAIN:  Are you having pain? Yes: NPRS scale: 0/10 Pain location: Bil knees R>L.  Pain description: sore Aggravating factors: standing, walking, stairs,  Relieving factors: none stated       PRECAUTIONS: None  RED FLAGS: None   WEIGHT BEARING RESTRICTIONS: No  FALLS:  Has patient fallen in last 6 months? No  LIVING ENVIRONMENT: Lives with: lives with their family Lives in: House/apartment Stairs: Yes: External: 2 floors steps; can reach both Has following equipment at home: None  OCCUPATION: works at a desk  PLOF: Independent  PATIENT GOALS: improve ability to navigate stairs, run, speed walking, jump, and workout with reduced pain/limitation  NEXT MD VISIT: February 2025  OBJECTIVE:  Note: Objective measures were completed at Evaluation unless otherwise noted.  DIAGNOSTIC FINDINGS:   PATIENT SURVEYS:  FOTO 64 (12/21/22)   COGNITION: Overall cognitive status: Within functional limits for  tasks assessed     SENSATION: WFL   MUSCLE LENGTH: Hamstrings: Right WFL ; Left WFL    POSTURE: min R knee valgus and mild ankle overpronation (R>L). Bil knee hyperextension.   PALPATION: TTP bil medial cuneiform/ navicular and bil lateral knee joint line  LOWER EXTREMITY ROM: Hip ROM: WFL ,  Knee ROM: WFL hypermobile extension bilaterally,  Ankle ROM: WFL, hypermobile inversion  LOWER EXTREMITY MMT:  MMT Right eval Left eval  Hip flexion    Hip extension 5 4+  Hip abduction 5 5  Hip adduction    Hip internal rotation     Hip external rotation    Knee flexion 4 4  Knee extension 4 4  Ankle dorsiflexion    Ankle plantarflexion    Ankle inversion    Ankle eversion     (Blank rows = not tested)  LOWER EXTREMITY SPECIAL TESTS:    FUNCTIONAL TESTS:  Single Leg hop Test:  R- Pt able to hop for 10 secs before stopping d/t pain in foot.  L- Pt able to hop for 8 secs before stopping d/t pain in L knee   GAIT: Distance walked: 25 ft Assistive device utilized: None Level of assistance: Complete Independence Comments: R dynamic knee valgus and overpronation of R foot   TODAY'S TREATMENT:                                                                                                                              DATE:  01/18/2023 Therapeutic Exercise: Prone:   Supine: Bridging 2 x 15;  SLR 2 x 12 bil; Heel Taps 2x8; SA reaches x15; S/L: Seated:  Standing:  Side steps x4 laps, RTB; Sit to stands with leg bias, table at knee height x6 Stretches:  Neuromuscular Re-education: Manual Therapy: Therapeutic Activity: Self Care: Tennis ball STM to thoracic spine/standing    Previous Therapeutic Exercise: Prone:   Supine: Bridging 2 x 15;  SLR 2 x 12 bil; Heel Taps 2x8;  S/L: Hip abd 2 x15 bil;  Seated:  LAQ 3 lb 2 x 15 bil;  Standing:  Squats with 15# KB x12; Pallof Press Bil x12 GTB; Calf Raises 2x15 Stretches:  Neuromuscular Re-education: Manual Therapy: Therapeutic Activity: Self Care:   Therapeutic Exercise: Prone:   Supine: Bridging 2 x 10;  SLR 2 x 12 bil;  S/L: hip abd 2 x15 bil;  Seated:  LAQ 3 lb 2 x 15 bil;  Standing: Squats with dowel x 10 with education on back mechanics and core re;  Step ups 6 in fwd, no UE support, x 12 bil with cuing for form; Squats with 15# KB x12; Eccentric step downs bil x10 Stretches:  Neuromuscular Re-education: Manual Therapy: Therapeutic Activity: Self Care:   Therapeutic Exercise: prone:   Supine: bridging 2 x 10;   LTR 2 minutes slow controlled,   SLR 2 x 10 bil;  S/L: hip abd 2 x10 bil;  Seated:  LAQ 3 lb 2 x 10 bil;   Sit to stand 2 x 10 with education on LE and back mechanics Standing:Squat to table x 10;  step ups 6 in fwd, no UE support, x 12 bil with cuing for form.  Stretches:  Neuromuscular Re-education: Manual Therapy: Therapeutic Activity: Self Care:      PATIENT EDUCATION:  Education: on anti-itching interventions Person educated: Patient Education method: Explanation, Demonstration, and Verbal cues Education comprehension: verbalized understanding and returned demonstration  HOME EXERCISE PROGRAM: [id number]  ASSESSMENT:  CLINICAL IMPRESSION:  01/18/2023 Session focused on ther ex to promote LE strengthening. Pt continues to show improvements in global LE strength and control. Pt had difficulty with eccentric control during biased sit to stands (R>L) secondary to muscular strength and endurance. Continue to progress LE strengthening in weightbearing positions as tolerated.      Eval: Patient is a 27 y.o.f who was seen today for physical therapy evaluation and treatment for bil foot and knee pain. Primary impairments include hypermobility of Bil knees and feet/ankles and muscular performance deficits of bil knee & feet/ankles. She also has bilateral shoulder pain, plan to give pt HEP for this in future sessions, but will focus on knee and ankle pain at this time. These impairments limit her ability to run, speed walk, navigate stairs, jump, and workout without pain/limitation. Pt will benefit from skilled physical therapy to work on these impairments.  OBJECTIVE IMPAIRMENTS: decreased activity tolerance, decreased coordination, decreased endurance, decreased strength, and pain.   ACTIVITY LIMITATIONS: carrying, lifting, bending, standing, squatting, stairs, and locomotion level  PARTICIPATION LIMITATIONS: working out with Systems analyst, exercises such as pushups (pain in wrists), planks (pain in wrists), pain  in knees- KB swings, squats, & jumping jacks   PERSONAL FACTORS: Past/current experiences are also affecting patient's functional outcome.   REHAB POTENTIAL: Good  CLINICAL DECISION MAKING: Stable/uncomplicated  EVALUATION COMPLEXITY: Low   GOALS: Goals reviewed with patient? Yes  SHORT TERM GOALS: Target date: 01/04/2023  Pt will be independent in initial HEP Goal status: MET  2.  Pt will report ability to speed walk with less than 3/10 pain.   Goal status: INITIAL     LONG TERM GOALS: Target date: 02/15/2023  Pt will report 75% improvement in the ability to navigate stairs.  Goal status: INITIAL  2.  Pt will report ability to run for 10 minutes with <3/10 pain in her knees & feet.  Goal status: INITIAL  3.  Pt will report 75% improvement in the her knee and ankle pain with LE workout   Goal status: INITIAL  4.   Pt will demo ability to perform 20 seconds of single leg hops to promote ability to return to running.   Goal status: INITIAL     PLAN:  PT FREQUENCY: 1-2x/week  PT DURATION: 8 weeks  PLANNED INTERVENTIONS: 97110-Therapeutic exercises, 97530- Therapeutic activity, 97112- Neuromuscular re-education, 97535- Self Care, 54098- Manual therapy, [phone]- Ionotophoresis 4mg /ml Dexamethasone, Patient/Family education, Balance training, Stair training, Taping, Dry Needling, Joint mobilization, Joint manipulation, Spinal manipulation, Spinal mobilization, Cryotherapy, and Moist heat  PLAN FOR NEXT SESSION: Look to add additional core re and strengthening interventions - patient likes verbal/tactile cues and to know where she should feel it (needs additional cues to avoid compensatory strategies).   [facility] [place], SPT   This entire session was performed under direct supervision and direction of a licensed Estate agent. I have personally read, edited and approve of the note as written.  Sedalia Muta, PT,  DPT 11:38 AM   01/18/23

## 2023-01-18 ENCOUNTER — Encounter: Payer: Self-pay | Admitting: Physical Therapy

## 2023-01-20 ENCOUNTER — Ambulatory Visit (INDEPENDENT_AMBULATORY_CARE_PROVIDER_SITE_OTHER): Payer: 59 | Admitting: Physical Therapy

## 2023-01-20 DIAGNOSIS — M25572 Pain in left ankle and joints of left foot: Secondary | ICD-10-CM

## 2023-01-20 DIAGNOSIS — M6281 Muscle weakness (generalized): Secondary | ICD-10-CM | POA: Diagnosis not present

## 2023-01-20 DIAGNOSIS — M25571 Pain in right ankle and joints of right foot: Secondary | ICD-10-CM

## 2023-01-20 DIAGNOSIS — M25562 Pain in left knee: Secondary | ICD-10-CM

## 2023-01-20 DIAGNOSIS — M25561 Pain in right knee: Secondary | ICD-10-CM

## 2023-01-20 DIAGNOSIS — G8929 Other chronic pain: Secondary | ICD-10-CM

## 2023-01-20 NOTE — Therapy (Cosign Needed)
OUTPATIENT PHYSICAL THERAPY LOWER EXTREMITY Treatment   Patient Name: Hailey Patrick MRN: [id number] DOB:October 16, 1995, 27 y.o., female Today's Date: 01/20/2023   END OF SESSION:  PT End of Session - 01/23/23 2029     Visit Number 8    Number of Visits 16    Date for PT Re-Evaluation 02/15/23    Authorization Type [employer]    PT Start Time 1522    PT Stop Time 1600    PT Time Calculation (min) 38 min    Activity Tolerance Patient tolerated treatment well;No increased pain    Behavior During Therapy WFL for tasks assessed/performed                   Past Medical History:  Diagnosis Date   Allergy    Sesonal   Asthma    as a young child   Obesity    Seizures (HCC)    febrile age of 2. Stare spell- last one at 54 when menes begin   Past Surgical History:  Procedure Laterality Date   ANTERIOR CRUCIATE LIGAMENT REPAIR Right 08/29/2012   Procedure: RIGHT KNEE ANTERIOR CRUCIATE LIGAMENT RECONSTRUCTION, HAMSTRING AUTOGRAFT;  Surgeon: Cammy Copa, MD;  Location: [facility] OR;  Service: Orthopedics;  Laterality: Right;   There are no problems to display for this patient.   PCP: Fleet Contras, MD  REFERRING PROVIDER: Ozella Rocks, MD  REFERRING DIAG: Bilateral knee, foot, and shoulder pain  THERAPY DIAG:  Hypermobility of bilateral knees and ankle/feet. Muscular performance deficits of Bil Les.  Rationale for Evaluation and Treatment: Rehabilitation  ONSET DATE: Over a year ago  SUBJECTIVE:   SUBJECTIVE STATEMENT:  01/20/2023 Pt states her thoracic pain hasn't improved and has become "stabby". Thoracic pain feels best in the morning and after laying down but feels worse whenever her trunk is vertical.     Eval: Pt states from January 2023 until now, she lost > 100 lbs and has developed increasing pain in her knees, feet, and bilateral shoulders. Pt's foot pain started a few years ago, knee pain within the last year, and shoulder pain  within last of weeks. Pt states her MD told her a probable cause was the changing of her body mechanics secondary to weight loss. Aggravating factors for her knees & feet include, stairs, jumping, running, working out, & speed walking. Alleviating factors include, Voltaren, ice, heat or Ibuprofen. Pt states she does experience N/T in her feet, but believes it may be caused by the new compression sleeves her MD told her to use. Pt had L ACL surgery 10 years ago.   PERTINENT HISTORY: Acl surgery 2014,   PAIN:  Are you having pain? Yes: NPRS scale: 0/10 Pain location: Bil knees R>L.  Pain description: sore Aggravating factors: standing, walking, stairs,  Relieving factors: none stated       PRECAUTIONS: None  RED FLAGS: None   WEIGHT BEARING RESTRICTIONS: No  FALLS:  Has patient fallen in last 6 months? No  LIVING ENVIRONMENT: Lives with: lives with their family Lives in: House/apartment Stairs: Yes: External: 2 floors steps; can reach both Has following equipment at home: None  OCCUPATION: works at a desk  PLOF: Independent  PATIENT GOALS: improve ability to navigate stairs, run, speed walking, jump, and workout with reduced pain/limitation  NEXT MD VISIT: February 2025  OBJECTIVE:  Note: Objective measures were completed at Evaluation unless otherwise noted.  DIAGNOSTIC FINDINGS:   PATIENT SURVEYS:  FOTO 64 (12/21/22)  COGNITION: Overall cognitive status: Within functional limits for tasks assessed     SENSATION: WFL   MUSCLE LENGTH: Hamstrings: Right WFL ; Left WFL    POSTURE: min R knee valgus and mild ankle overpronation (R>L). Bil knee hyperextension.   PALPATION: TTP bil medial cuneiform/ navicular and bil lateral knee joint line  LOWER EXTREMITY ROM: Hip ROM: WFL ,  Knee ROM: WFL hypermobile extension bilaterally,  Ankle ROM: WFL, hypermobile inversion  LOWER EXTREMITY MMT:  MMT Right eval Left eval  Hip flexion    Hip extension 5 4+   Hip abduction 5 5  Hip adduction    Hip internal rotation    Hip external rotation    Knee flexion 4 4  Knee extension 4 4  Ankle dorsiflexion    Ankle plantarflexion    Ankle inversion    Ankle eversion     (Blank rows = not tested)  LOWER EXTREMITY SPECIAL TESTS:    FUNCTIONAL TESTS:  Single Leg hop Test:  R- Pt able to hop for 10 secs before stopping d/t pain in foot.  L- Pt able to hop for 8 secs before stopping d/t pain in L knee   GAIT: Distance walked: 25 ft Assistive device utilized: None Level of assistance: Complete Independence Comments: R dynamic knee valgus and overpronation of R foot   TODAY'S TREATMENT:                                                                                                                              DATE:  01/23/2023 Therapeutic Exercise: Prone: Cat cow x15; Thoracic rotation, thread the needle Bil 8x10" Supine: Single leg bias Bridging 2x10;  Heel Taps 2x8;  S/L: Seated:  Standing:   Sit to stands with leg bias, table at knee height x8;  Pallof press BTB, x15; Single leg bias RDL, x10, 15# KB; Side steps x4 laps, RTB; Stretches:  Neuromuscular Re-education: Manual Therapy: Self Care:    Previous Therapeutic Exercise: Prone:   Supine: Bridging 2 x 15;  SLR 2 x 12 bil; Heel Taps 2x8; SA reaches x15; S/L: Seated:  Standing:  Side steps x4 laps, RTB; Sit to stands with leg bias, table at knee height x6 Stretches:  Neuromuscular Re-education: Manual Therapy: Therapeutic Activity: Self Care: Tennis ball STM to thoracic spine/standing    Therapeutic Exercise: Prone:   Supine: Bridging 2 x 15;  SLR 2 x 12 bil; Heel Taps 2x8;  S/L: Hip abd 2 x15 bil;  Seated:  LAQ 3 lb 2 x 15 bil;  Standing:  Squats with 15# KB x12; Pallof Press Bil x12 GTB; Calf Raises 2x15 Stretches:  Neuromuscular Re-education: Manual Therapy: Therapeutic Activity: Self Care:   Therapeutic Exercise: Prone:   Supine: Bridging 2 x 10;  SLR 2 x  12 bil;  S/L: hip abd 2 x15 bil;  Seated:  LAQ 3 lb 2 x 15 bil;  Standing: Squats with dowel x 10 with education  on back mechanics and core re;  Step ups 6 in fwd, no UE support, x 12 bil with cuing for form; Squats with 15# KB x12; Eccentric step downs bil x10 Stretches:  Neuromuscular Re-education: Manual Therapy: Therapeutic Activity: Self Care:    PATIENT EDUCATION:  Education: Education on adjusting PT frequency to 1x/week after next 2 sessions to prepare for D/C. Person educated: Patient Education method: Explanation, Demonstration, and Verbal cues Education comprehension: verbalized understanding and returned demonstration  HOME EXERCISE PROGRAM: [id number]  ASSESSMENT:  CLINICAL IMPRESSION:  01/20/2023 Session focused on ther ex to promote global LE strengthening and theract for return to functional tasks. Pt continues to show improvements in global LE strength as displayed with less knee and ankle/foot pain and progression of several exercises and increased eccentric control. Pt able to progress to single leg bias bridges and RDLs with appropriate form and control. Pt continues to have difficulty with muscular endurance with higher levels LE interventions -although steady progress is being made. Pt education regarding POC timeline provided and pt will move to 1x/week after the next two sessions to prepare for D/C. Pt agrees with POC. Future sessions should promote full return to functional gym tasks.    Eval: Patient is a 27 y.o.f who was seen today for physical therapy evaluation and treatment for bil foot and knee pain. Primary impairments include hypermobility of Bil knees and feet/ankles and muscular performance deficits of bil knee & feet/ankles. She also has bilateral shoulder pain, plan to give pt HEP for this in future sessions, but will focus on knee and ankle pain at this time. These impairments limit her ability to run, speed walk, navigate stairs, jump, and workout  without pain/limitation. Pt will benefit from skilled physical therapy to work on these impairments.  OBJECTIVE IMPAIRMENTS: decreased activity tolerance, decreased coordination, decreased endurance, decreased strength, and pain.   ACTIVITY LIMITATIONS: carrying, lifting, bending, standing, squatting, stairs, and locomotion level  PARTICIPATION LIMITATIONS: working out with Systems analyst, exercises such as pushups (pain in wrists), planks (pain in wrists), pain in knees- KB swings, squats, & jumping jacks   PERSONAL FACTORS: Past/current experiences are also affecting patient's functional outcome.   REHAB POTENTIAL: Good  CLINICAL DECISION MAKING: Stable/uncomplicated  EVALUATION COMPLEXITY: Low   GOALS: Goals reviewed with patient? Yes  SHORT TERM GOALS: Target date: 01/04/2023  Pt will be independent in initial HEP Goal status: MET  2.  Pt will report ability to speed walk with less than 3/10 pain.   Goal status: INITIAL     LONG TERM GOALS: Target date: 02/15/2023  Pt will report 75% improvement in the ability to navigate stairs.  Goal status: INITIAL  2.  Pt will report ability to run for 10 minutes with <3/10 pain in her knees & feet.  Goal status: INITIAL  3.  Pt will report 75% improvement in the her knee and ankle pain with LE workout   Goal status: INITIAL  4.   Pt will demo ability to perform 20 seconds of single leg hops to promote ability to return to running.   Goal status: INITIAL     PLAN:  PT FREQUENCY: 1-2x/week  PT DURATION: 8 weeks  PLANNED INTERVENTIONS: 97110-Therapeutic exercises, 97530- Therapeutic activity, 97112- Neuromuscular re-education, 97535- Self Care, 47829- Manual therapy, [id number]- Ionotophoresis 4mg /ml Dexamethasone, Patient/Family education, Balance training, Stair training, Taping, Dry Needling, Joint mobilization, Joint manipulation, Spinal manipulation, Spinal mobilization, Cryotherapy, and Moist heat  PLAN FOR NEXT  SESSION: Look to add additional core  re and strengthening interventions - patient likes verbal/tactile cues and to know where she should feel it (needs additional cues to avoid compensatory strategies).  F/u [facility], SPT   This entire session was performed under direct supervision and direction of a licensed Estate agent. I have personally read, edited and approve of the note as written.  Sedalia Muta, PT, DPT 8:30 PM  01/23/23

## 2023-01-23 ENCOUNTER — Encounter: Payer: Self-pay | Admitting: Physical Therapy

## 2023-01-24 ENCOUNTER — Encounter: Payer: Self-pay | Admitting: Physical Therapy

## 2023-01-24 ENCOUNTER — Ambulatory Visit (INDEPENDENT_AMBULATORY_CARE_PROVIDER_SITE_OTHER): Payer: 59 | Admitting: Physical Therapy

## 2023-01-24 DIAGNOSIS — M25562 Pain in left knee: Secondary | ICD-10-CM

## 2023-01-24 DIAGNOSIS — G8929 Other chronic pain: Secondary | ICD-10-CM

## 2023-01-24 DIAGNOSIS — M25571 Pain in right ankle and joints of right foot: Secondary | ICD-10-CM

## 2023-01-24 DIAGNOSIS — M25572 Pain in left ankle and joints of left foot: Secondary | ICD-10-CM | POA: Diagnosis not present

## 2023-01-24 DIAGNOSIS — M25561 Pain in right knee: Secondary | ICD-10-CM | POA: Diagnosis not present

## 2023-01-24 DIAGNOSIS — M6281 Muscle weakness (generalized): Secondary | ICD-10-CM

## 2023-01-24 NOTE — Therapy (Signed)
OUTPATIENT PHYSICAL THERAPY LOWER EXTREMITY Treatment   Patient Name: Hailey Patrick MRN: [id number] DOB:Aug 07, 1995, 27 y.o., female Today's Date: 01/24/2023   END OF SESSION:  PT End of Session - 01/24/23 1522     Visit Number 9    Number of Visits 16    Date for PT Re-Evaluation 02/15/23    Authorization Type [employer]    PT Start Time 1518    PT Stop Time 1558    PT Time Calculation (min) 40 min    Activity Tolerance Patient tolerated treatment well;No increased pain    Behavior During Therapy WFL for tasks assessed/performed                    Past Medical History:  Diagnosis Date   Allergy    Sesonal   Asthma    as a young child   Obesity    Seizures (HCC)    febrile age of 2. Stare spell- last one at 80 when menes begin   Past Surgical History:  Procedure Laterality Date   ANTERIOR CRUCIATE LIGAMENT REPAIR Right 08/29/2012   Procedure: RIGHT KNEE ANTERIOR CRUCIATE LIGAMENT RECONSTRUCTION, HAMSTRING AUTOGRAFT;  Surgeon: Cammy Copa, MD;  Location: [facility] OR;  Service: Orthopedics;  Laterality: Right;   There are no problems to display for this patient.   PCP: Fleet Contras, MD  REFERRING PROVIDER: Ozella Rocks, MD  REFERRING DIAG: Bilateral knee, foot, and shoulder pain  THERAPY DIAG:  Hypermobility of bilateral knees and ankle/feet. Muscular performance deficits of Bil Les.  Rationale for Evaluation and Treatment: Rehabilitation  ONSET DATE: Over a year ago  SUBJECTIVE:   SUBJECTIVE STATEMENT: 01/24/2023 Pt states she received dry needling to her thoracic region from her MD today and is "very sore". Pt reports her knees aren't causing pain during ADLs.    Eval: Pt states from January 2023 until now, she lost > 100 lbs and has developed increasing pain in her knees, feet, and bilateral shoulders. Pt's foot pain started a few years ago, knee pain within the last year, and shoulder pain within last of weeks. Pt states  her MD told her a probable cause was the changing of her body mechanics secondary to weight loss. Aggravating factors for her knees & feet include, stairs, jumping, running, working out, & speed walking. Alleviating factors include, Voltaren, ice, heat or Ibuprofen. Pt states she does experience N/T in her feet, but believes it may be caused by the new compression sleeves her MD told her to use. Pt had L ACL surgery 10 years ago.   PERTINENT HISTORY: Acl surgery 2014,   PAIN:  Are you having pain? Yes: NPRS scale: 0/10 Pain location: Bil knees R>L.  Pain description: sore Aggravating factors: standing, walking, stairs,  Relieving factors: none stated       PRECAUTIONS: None  RED FLAGS: None   WEIGHT BEARING RESTRICTIONS: No  FALLS:  Has patient fallen in last 6 months? No  LIVING ENVIRONMENT: Lives with: lives with their family Lives in: House/apartment Stairs: Yes: External: 2 floors steps; can reach both Has following equipment at home: None  OCCUPATION: works at a desk  PLOF: Independent  PATIENT GOALS: improve ability to navigate stairs, run, speed walking, jump, and workout with reduced pain/limitation  NEXT MD VISIT: February 2025  OBJECTIVE:  Note: Objective measures were completed at Evaluation unless otherwise noted.  DIAGNOSTIC FINDINGS:   PATIENT SURVEYS:  FOTO 64 (12/21/22)   COGNITION: Overall cognitive status:  Within functional limits for tasks assessed     SENSATION: [facility]   MUSCLE LENGTH: Hamstrings: Right WFL ; Left WFL    POSTURE: min R knee valgus and mild ankle overpronation (R>L). Bil knee hyperextension.   PALPATION: TTP bil medial cuneiform/ navicular and bil lateral knee joint line  LOWER EXTREMITY ROM: Hip ROM: WFL ,  Knee ROM: WFL hypermobile extension bilaterally,  Ankle ROM: WFL, hypermobile inversion  LOWER EXTREMITY MMT:  MMT Right eval Left eval  Hip flexion    Hip extension 5 4+  Hip abduction 5 5  Hip adduction     Hip internal rotation    Hip external rotation    Knee flexion 4 4  Knee extension 4 4  Ankle dorsiflexion    Ankle plantarflexion    Ankle inversion    Ankle eversion     (Blank rows = not tested)  LOWER EXTREMITY SPECIAL TESTS:    FUNCTIONAL TESTS:  Single Leg hop Test:  R- Pt able to hop for 10 secs before stopping d/t pain in foot.  L- Pt able to hop for 8 secs before stopping d/t pain in L knee   GAIT: Distance walked: 25 ft Assistive device utilized: None Level of assistance: Complete Independence Comments: R dynamic knee valgus and overpronation of R foot   TODAY'S TREATMENT:                                                                                                                              DATE:  01/24/2023 Therapeutic Exercise: Prone: Cat cow x15; Thoracic rotation, thread the needle Bil 8x10" Supine: Single leg bias Bridging 2x10;  Swiss ball bridges x8, orange ball; S/L: Seated: 3-way ball rollouts x6 mins Standing:  Sit to stands with leg bias, table at knee height x8;   Side steps x4 laps, GTB; Stretches:  Neuromuscular Re-education: Manual Therapy: Self Care:    Previous Therapeutic Exercise: Prone: Cat cow x15; Thoracic rotation, thread the needle Bil 8x10" Supine: Single leg bias Bridging 2x10;  Heel Taps 2x8;  S/L: Seated:  Standing:   Sit to stands with leg bias, table at knee height x8;  Pallof press BTB, x15; Single leg bias RDL, x10, 15# KB; Side steps x4 laps, RTB; Stretches:  Neuromuscular Re-education: Manual Therapy: Self Care:    Therapeutic Exercise: Prone:   Supine: Bridging 2 x 15;  SLR 2 x 12 bil; Heel Taps 2x8; SA reaches x15; S/L: Seated:  Standing:  Side steps x4 laps, RTB; Sit to stands with leg bias, table at knee height x6 Stretches:  Neuromuscular Re-education: Manual Therapy: Therapeutic Activity: Self Care: Tennis ball STM to thoracic spine/standing       PATIENT EDUCATION:  Education: Education  on adjusting PT frequency to 1x/week after next 2 sessions to prepare for D/C. Person educated: Patient Education method: Explanation, Demonstration, and Verbal cues Education comprehension: verbalized understanding and returned demonstration  HOME EXERCISE PROGRAM: [id number]  ASSESSMENT:  CLINICAL IMPRESSION:  01/24/2023 Session focused on ther ex to promote global LE strengthening and thoracolumbar mobility. Pt continues to present with thoracic pain that limits her ADLs and perform LE interventions secondary to pain. Pt continues to present with LE improvements as seen with less pain during activity and PT exercises. Pt continues to have difficulty with muscular endurance of Bil LEs and would benefit from continued skilled care to address it and return to functional tasks. Pt able to perform all exercises without increased pain or modification.    Eval: Patient is a 27 y.o.f who was seen today for physical therapy evaluation and treatment for bil foot and knee pain. Primary impairments include hypermobility of Bil knees and feet/ankles and muscular performance deficits of bil knee & feet/ankles. She also has bilateral shoulder pain, plan to give pt HEP for this in future sessions, but will focus on knee and ankle pain at this time. These impairments limit her ability to run, speed walk, navigate stairs, jump, and workout without pain/limitation. Pt will benefit from skilled physical therapy to work on these impairments.  OBJECTIVE IMPAIRMENTS: decreased activity tolerance, decreased coordination, decreased endurance, decreased strength, and pain.   ACTIVITY LIMITATIONS: carrying, lifting, bending, standing, squatting, stairs, and locomotion level  PARTICIPATION LIMITATIONS: working out with Systems analyst, exercises such as pushups (pain in wrists), planks (pain in wrists), pain in knees- KB swings, squats, & jumping jacks   PERSONAL FACTORS: Past/current experiences are also affecting  patient's functional outcome.   REHAB POTENTIAL: Good  CLINICAL DECISION MAKING: Stable/uncomplicated  EVALUATION COMPLEXITY: Low   GOALS: Goals reviewed with patient? Yes  SHORT TERM GOALS: Target date: 01/04/2023  Pt will be independent in initial HEP Goal status: MET  2.  Pt will report ability to speed walk with less than 3/10 pain.   Goal status: INITIAL     LONG TERM GOALS: Target date: 02/15/2023  Pt will report 75% improvement in the ability to navigate stairs.  Goal status: INITIAL  2.  Pt will report ability to run for 10 minutes with <3/10 pain in her knees & feet.  Goal status: INITIAL  3.  Pt will report 75% improvement in the her knee and ankle pain with LE workout   Goal status: INITIAL  4.   Pt will demo ability to perform 20 seconds of single leg hops to promote ability to return to running.   Goal status: INITIAL     PLAN:  PT FREQUENCY: 1-2x/week  PT DURATION: 8 weeks  PLANNED INTERVENTIONS: 97110-Therapeutic exercises, 97530- Therapeutic activity, 97112- Neuromuscular re-education, 97535- Self Care, 24401- Manual therapy, [id number]- Ionotophoresis 4mg /ml Dexamethasone, Patient/Family education, Balance training, Stair training, Taping, Dry Needling, Joint mobilization, Joint manipulation, Spinal manipulation, Spinal mobilization, Cryotherapy, and Moist heat  PLAN FOR NEXT SESSION: Look to add theract to promote return to gym activities as tolerated. - patient likes verbal/tactile cues and to know where she should feel it (needs additional cues to avoid compensatory strategies).  F/u [facility], SPT   This entire session was performed under direct supervision and direction of a licensed Estate agent. I have personally read, edited and approve of the note as written.  Sedalia Muta, PT, DPT 4:14 PM  01/24/23

## 2023-01-27 ENCOUNTER — Ambulatory Visit: Payer: 59 | Admitting: Physical Therapy

## 2023-01-27 DIAGNOSIS — M25572 Pain in left ankle and joints of left foot: Secondary | ICD-10-CM | POA: Diagnosis not present

## 2023-01-27 DIAGNOSIS — M6281 Muscle weakness (generalized): Secondary | ICD-10-CM | POA: Diagnosis not present

## 2023-01-27 DIAGNOSIS — M25561 Pain in right knee: Secondary | ICD-10-CM | POA: Diagnosis not present

## 2023-01-27 DIAGNOSIS — G8929 Other chronic pain: Secondary | ICD-10-CM

## 2023-01-27 DIAGNOSIS — M25562 Pain in left knee: Secondary | ICD-10-CM | POA: Diagnosis not present

## 2023-01-27 NOTE — Therapy (Signed)
OUTPATIENT PHYSICAL THERAPY LOWER EXTREMITY Treatment   Patient Name: Hailey Patrick MRN: [id number] DOB:09-23-1995, 27 y.o., female Today's Date: 01/27/2023   END OF SESSION:  PT End of Session - 01/27/23 1521     Visit Number 10    Number of Visits 16    Date for PT Re-Evaluation 02/15/23    Authorization Type [employer]    PT Start Time 1523    PT Stop Time 1602    PT Time Calculation (min) 39 min    Activity Tolerance Patient tolerated treatment well;No increased pain    Behavior During Therapy WFL for tasks assessed/performed                     Past Medical History:  Diagnosis Date   Allergy    Sesonal   Asthma    as a young child   Obesity    Seizures (HCC)    febrile age of 2. Stare spell- last one at 64 when menes begin   Past Surgical History:  Procedure Laterality Date   ANTERIOR CRUCIATE LIGAMENT REPAIR Right 08/29/2012   Procedure: RIGHT KNEE ANTERIOR CRUCIATE LIGAMENT RECONSTRUCTION, HAMSTRING AUTOGRAFT;  Surgeon: Cammy Copa, MD;  Location: [facility] OR;  Service: Orthopedics;  Laterality: Right;   There are no active problems to display for this patient.   PCP: Fleet Contras, MD  REFERRING PROVIDER: Ozella Rocks, MD  REFERRING DIAG: Bilateral knee, foot, and shoulder pain  THERAPY DIAG:  Hypermobility of bilateral knees and ankle/feet. Muscular performance deficits of Bil Les.  Rationale for Evaluation and Treatment: Rehabilitation  ONSET DATE: Over a year ago  SUBJECTIVE:   SUBJECTIVE STATEMENT: 01/27/2023 Pt states her thoracic pain is still bothersome and is affecting her ADLs. Knees are doing much better, feet still sore at times, with lateral movement while wearing sneakers/hokas   Eval: Pt states from January 2023 until now, she lost > 100 lbs and has developed increasing pain in her knees, feet, and bilateral shoulders. Pt's foot pain started a few years ago, knee pain within the last year, and  shoulder pain within last of weeks. Pt states her MD told her a probable cause was the changing of her body mechanics secondary to weight loss. Aggravating factors for her knees & feet include, stairs, jumping, running, working out, & speed walking. Alleviating factors include, Voltaren, ice, heat or Ibuprofen. Pt states she does experience N/T in her feet, but believes it may be caused by the new compression sleeves her MD told her to use. Pt had L ACL surgery 10 years ago.   PERTINENT HISTORY: Acl surgery 2014,   PAIN:  Are you having pain? Yes: NPRS scale: 0-2/10 Pain location: Bil knees R>L.  Pain description: sore Aggravating factors: standing, walking, stairs,  Relieving factors: none stated     PRECAUTIONS: None  RED FLAGS: None   WEIGHT BEARING RESTRICTIONS: No  FALLS:  Has patient fallen in last 6 months? No  LIVING ENVIRONMENT: Lives with: lives with their family Lives in: House/apartment Stairs: Yes: External: 2 floors steps; can reach both Has following equipment at home: None  OCCUPATION: works at a desk  PLOF: Independent  PATIENT GOALS: improve ability to navigate stairs, run, speed walking, jump, and workout with reduced pain/limitation  NEXT MD VISIT: February 2025  OBJECTIVE:  Note: Objective measures were completed at Evaluation unless otherwise noted.  DIAGNOSTIC FINDINGS:   PATIENT SURVEYS:  FOTO 64 (12/21/22)   COGNITION: Overall cognitive  status: Within functional limits for tasks assessed     SENSATION: WFL   MUSCLE LENGTH: Hamstrings: Right WFL ; Left WFL    POSTURE: min R knee valgus and mild ankle overpronation (R>L). Bil knee hyperextension.   PALPATION: TTP bil medial cuneiform/ navicular and bil lateral knee joint line  LOWER EXTREMITY ROM: Hip ROM: WFL ,  Knee ROM: WFL hypermobile extension bilaterally,  Ankle ROM: WFL, hypermobile inversion  LOWER EXTREMITY MMT:  MMT Right eval Left eval  Hip flexion    Hip  extension 5 4+  Hip abduction 5 5  Hip adduction    Hip internal rotation    Hip external rotation    Knee flexion 4 4  Knee extension 4 4  Ankle dorsiflexion    Ankle plantarflexion    Ankle inversion    Ankle eversion     (Blank rows = not tested)  LOWER EXTREMITY SPECIAL TESTS:    FUNCTIONAL TESTS:  Single Leg hop Test:  R- Pt able to hop for 10 secs before stopping d/t pain in foot.  L- Pt able to hop for 8 secs before stopping d/t pain in L knee   GAIT: Distance walked: 25 ft Assistive device utilized: None Level of assistance: Complete Independence Comments: R dynamic knee valgus and overpronation of R foot   TODAY'S TREATMENT:                                                                                                                              DATE:  01/27/2023 Therapeutic Exercise: Prone: Cat cow x15; laying on yellow ball for upper thoracic traction x3 mins Supine: SA reaches 15x5"; Single leg bias Bridging 2x10;  Heel Taps x12;  S/L: Seated:  Standing:  ; Stretches:  Neuromuscular Re-education: Therapeutic Activity: RDL with single leg bias, 16#, x12;  Side steps x4 laps, GTB Manual Therapy: Self Care:    Previous Therapeutic Exercise: Prone: Cat cow x15; Thoracic rotation, thread the needle Bil 8x10" Supine:  Single leg bias Bridging 2x10;  Heel Taps 2x8; Swiss ball bridges x8, orange ball; S/L: Seated: 3-way ball rollouts x6 mins Standing:  Sit to stands with leg bias, table at knee height x8;   Side steps x4 laps, GTB; Stretches:  Neuromuscular Re-education: Manual Therapy: Self Care:    Therapeutic Exercise: Prone: Cat cow x15; Thoracic rotation, thread the needle Bil 8x10" Supine: Single leg bias Bridging 2x10;  Heel Taps 2x8;  S/L: Seated:  Standing:   Sit to stands with leg bias, table at knee height x8;  Pallof press BTB, x15; Single leg bias RDL, x10, 15# KB; Side steps x4 laps, RTB; Stretches:  Neuromuscular  Re-education: Manual Therapy: Self Care:    Therapeutic Exercise: Prone:   Supine: Bridging 2 x 15;  SLR 2 x 12 bil; Heel Taps 2x8; SA reaches x15; S/L: Seated:  Standing:  Side steps x4 laps, RTB; Sit to stands with leg bias, table at knee  height x6 Stretches:  Neuromuscular Re-education: Manual Therapy: Therapeutic Activity: Self Care: Tennis ball STM to thoracic spine/standing       PATIENT EDUCATION:  Education: Education on adjusting PT frequency to 1x/week after next 2 sessions to prepare for D/C. Person educated: Patient Education method: Explanation, Demonstration, and Verbal cues Education comprehension: verbalized understanding and returned demonstration  HOME EXERCISE PROGRAM: [id number]  ASSESSMENT:  CLINICAL IMPRESSION:  01/27/2023 Session focused on ther ex to promote global LE strengthening and thoracolumbar mobility and theract to promote return to functional goals. Pt reported reduced thoracic symptoms following thoracolumbar mobility interventions. Pt continues to show improvements in her LE strengthening as displayed with progression of theract reps and reduced knee and ankle pain. Continue to progress LE/ankle strengthening and endurance interventions to promote return to functional goals. Pt able to perform all exercises without increased pain or modification.     Eval: Patient is a 27 y.o.f who was seen today for physical therapy evaluation and treatment for bil foot and knee pain. Primary impairments include hypermobility of Bil knees and feet/ankles and muscular performance deficits of bil knee & feet/ankles. She also has bilateral shoulder pain, plan to give pt HEP for this in future sessions, but will focus on knee and ankle pain at this time. These impairments limit her ability to run, speed walk, navigate stairs, jump, and workout without pain/limitation. Pt will benefit from skilled physical therapy to work on these impairments.  OBJECTIVE  IMPAIRMENTS: decreased activity tolerance, decreased coordination, decreased endurance, decreased strength, and pain.   ACTIVITY LIMITATIONS: carrying, lifting, bending, standing, squatting, stairs, and locomotion level  PARTICIPATION LIMITATIONS: working out with Systems analyst, exercises such as pushups (pain in wrists), planks (pain in wrists), pain in knees- KB swings, squats, & jumping jacks   PERSONAL FACTORS: Past/current experiences are also affecting patient's functional outcome.   REHAB POTENTIAL: Good  CLINICAL DECISION MAKING: Stable/uncomplicated  EVALUATION COMPLEXITY: Low   GOALS: Goals reviewed with patient? Yes  SHORT TERM GOALS: Target date: 01/04/2023  Pt will be independent in initial HEP Goal status: MET  2.  Pt will report ability to speed walk with less than 3/10 pain.   Goal status: INITIAL     LONG TERM GOALS: Target date: 02/15/2023  Pt will report 75% improvement in the ability to navigate stairs.  Goal status: INITIAL  2.  Pt will report ability to run for 10 minutes with <3/10 pain in her knees & feet.  Goal status: INITIAL  3.  Pt will report 75% improvement in the her knee and ankle pain with LE workout   Goal status: INITIAL  4.   Pt will demo ability to perform 20 seconds of single leg hops to promote ability to return to running.   Goal status: INITIAL     PLAN:  PT FREQUENCY: 1-2x/week  PT DURATION: 8 weeks  PLANNED INTERVENTIONS: 97110-Therapeutic exercises, 97530- Therapeutic activity, 97112- Neuromuscular re-education, 97535- Self Care, 47425- Manual therapy, [phone]- Ionotophoresis 4mg /ml Dexamethasone, Patient/Family education, Balance training, Stair training, Taping, Dry Needling, Joint mobilization, Joint manipulation, Spinal manipulation, Spinal mobilization, Cryotherapy, and Moist heat  PLAN FOR NEXT SESSION: Look to add theract to promote return to gym activities as tolerated. - patient likes verbal/tactile cues  and to know where she should feel it (needs additional cues to avoid compensatory strategies).  F/u [facility], SPT   This entire session was performed under direct supervision and direction of a licensed Estate agent. I have personally read,  edited and approve of the note as written.  Sedalia Muta, PT, DPT 4:32 PM  01/27/23

## 2023-01-28 ENCOUNTER — Encounter: Payer: Self-pay | Admitting: Physical Therapy

## 2023-01-31 NOTE — Therapy (Unsigned)
OUTPATIENT PHYSICAL THERAPY LOWER EXTREMITY Treatment   Patient Name: Hailey Patrick MRN: [id number] DOB:12/01/1995, 27 y.o., female Today's Date: 01/31/2023   END OF SESSION:            Past Medical History:  Diagnosis Date   Allergy    Sesonal   Asthma    as a young child   Obesity    Seizures (HCC)    febrile age of 2. Stare spell- last one at 55 when menes begin   Past Surgical History:  Procedure Laterality Date   ANTERIOR CRUCIATE LIGAMENT REPAIR Right 08/29/2012   Procedure: RIGHT KNEE ANTERIOR CRUCIATE LIGAMENT RECONSTRUCTION, HAMSTRING AUTOGRAFT;  Surgeon: Cammy Copa, MD;  Location: [facility] OR;  Service: Orthopedics;  Laterality: Right;   There are no active problems to display for this patient.   PCP: Fleet Contras, MD  REFERRING PROVIDER: Ozella Rocks, MD  REFERRING DIAG: Bilateral knee, foot, and shoulder pain  THERAPY DIAG:  Hypermobility of bilateral knees and ankle/feet. Muscular performance deficits of Bil Les.  Rationale for Evaluation and Treatment: Rehabilitation  ONSET DATE: Over a year ago  SUBJECTIVE:   SUBJECTIVE STATEMENT: 01/31/2023 Pt states her thoracic pain is still bothersome and is affecting her ADLs. Knees are doing much better, feet still sore at times, with lateral movement while wearing sneakers/hokas   Eval: Pt states from January 2023 until now, she lost > 100 lbs and has developed increasing pain in her knees, feet, and bilateral shoulders. Pt's foot pain started a few years ago, knee pain within the last year, and shoulder pain within last of weeks. Pt states her MD told her a probable cause was the changing of her body mechanics secondary to weight loss. Aggravating factors for her knees & feet include, stairs, jumping, running, working out, & speed walking. Alleviating factors include, Voltaren, ice, heat or Ibuprofen. Pt states she does experience N/T in her feet, but believes it may be caused by the  new compression sleeves her MD told her to use. Pt had L ACL surgery 10 years ago.   PERTINENT HISTORY: Acl surgery 2014,   PAIN:  Are you having pain? Yes: NPRS scale: 0-2/10 Pain location: Bil knees R>L.  Pain description: sore Aggravating factors: standing, walking, stairs,  Relieving factors: none stated     PRECAUTIONS: None  RED FLAGS: None   WEIGHT BEARING RESTRICTIONS: No  FALLS:  Has patient fallen in last 6 months? No  LIVING ENVIRONMENT: Lives with: lives with their family Lives in: House/apartment Stairs: Yes: External: 2 floors steps; can reach both Has following equipment at home: None  OCCUPATION: works at a desk  PLOF: Independent  PATIENT GOALS: improve ability to navigate stairs, run, speed walking, jump, and workout with reduced pain/limitation  NEXT MD VISIT: February 2025  OBJECTIVE:  Note: Objective measures were completed at Evaluation unless otherwise noted.  DIAGNOSTIC FINDINGS:   PATIENT SURVEYS:  FOTO 64 (12/21/22)   COGNITION: Overall cognitive status: Within functional limits for tasks assessed     SENSATION: WFL   MUSCLE LENGTH: Hamstrings: Right WFL ; Left WFL    POSTURE: min R knee valgus and mild ankle overpronation (R>L). Bil knee hyperextension.   PALPATION: TTP bil medial cuneiform/ navicular and bil lateral knee joint line  LOWER EXTREMITY ROM: Hip ROM: WFL ,  Knee ROM: WFL hypermobile extension bilaterally,  Ankle ROM: WFL, hypermobile inversion  LOWER EXTREMITY MMT:  MMT Right eval Left eval  Hip flexion  Hip extension 5 4+  Hip abduction 5 5  Hip adduction    Hip internal rotation    Hip external rotation    Knee flexion 4 4  Knee extension 4 4  Ankle dorsiflexion    Ankle plantarflexion    Ankle inversion    Ankle eversion     (Blank rows = not tested)  LOWER EXTREMITY SPECIAL TESTS:    FUNCTIONAL TESTS:  Single Leg hop Test:  R- Pt able to hop for 10 secs before stopping d/t pain in  foot.  L- Pt able to hop for 8 secs before stopping d/t pain in L knee   GAIT: Distance walked: 25 ft Assistive device utilized: None Level of assistance: Complete Independence Comments: R dynamic knee valgus and overpronation of R foot   TODAY'S TREATMENT:                                                                                                                              DATE:  01/31/2023 Therapeutic Exercise: Prone: Cat cow x15; laying on yellow ball for upper thoracic traction x3 mins Supine: SA reaches 15x5"; Single leg bias Bridging 2x10;  Heel Taps x12;  S/L: Seated:  Standing:  ; Stretches:  Neuromuscular Re-education: Therapeutic Activity: RDL with single leg bias, 13#, x12;  Side steps x4 laps, GTB Manual Therapy: Self Care:    Previous Therapeutic Exercise: Prone: Cat cow x15; Thoracic rotation, thread the needle Bil 8x10" Supine:  Single leg bias Bridging 2x10;  Heel Taps 2x8; Swiss ball bridges x8, orange ball; S/L: Seated: 3-way ball rollouts x6 mins Standing:  Sit to stands with leg bias, table at knee height x8;   Side steps x4 laps, GTB; Stretches:  Neuromuscular Re-education: Manual Therapy: Self Care:    Therapeutic Exercise: Prone: Cat cow x15; Thoracic rotation, thread the needle Bil 8x10" Supine: Single leg bias Bridging 2x10;  Heel Taps 2x8;  S/L: Seated:  Standing:   Sit to stands with leg bias, table at knee height x8;  Pallof press BTB, x15; Single leg bias RDL, x10, 15# KB; Side steps x4 laps, RTB; Stretches:  Neuromuscular Re-education: Manual Therapy: Self Care:    Therapeutic Exercise: Prone:   Supine: Bridging 2 x 15;  SLR 2 x 12 bil; Heel Taps 2x8; SA reaches x15; S/L: Seated:  Standing:  Side steps x4 laps, RTB; Sit to stands with leg bias, table at knee height x6 Stretches:  Neuromuscular Re-education: Manual Therapy: Therapeutic Activity: Self Care: Tennis ball STM to thoracic spine/standing       PATIENT  EDUCATION:  Education: Education on adjusting PT frequency to 1x/week after next 2 sessions to prepare for D/C. Person educated: Patient Education method: Explanation, Demonstration, and Verbal cues Education comprehension: verbalized understanding and returned demonstration  HOME EXERCISE PROGRAM: [id number]  ASSESSMENT:  CLINICAL IMPRESSION:  01/31/2023 Session focused on ther ex to promote global LE strengthening and thoracolumbar mobility and theract to promote return  to functional goals. Pt reported reduced thoracic symptoms following thoracolumbar mobility interventions. Pt continues to show improvements in her LE strengthening as displayed with progression of theract reps and reduced knee and ankle pain. Continue to progress LE/ankle strengthening and endurance interventions to promote return to functional goals. Pt able to perform all exercises without increased pain or modification.     Eval: Patient is a 27 y.o.f who was seen today for physical therapy evaluation and treatment for bil foot and knee pain. Primary impairments include hypermobility of Bil knees and feet/ankles and muscular performance deficits of bil knee & feet/ankles. She also has bilateral shoulder pain, plan to give pt HEP for this in future sessions, but will focus on knee and ankle pain at this time. These impairments limit her ability to run, speed walk, navigate stairs, jump, and workout without pain/limitation. Pt will benefit from skilled physical therapy to work on these impairments.  OBJECTIVE IMPAIRMENTS: decreased activity tolerance, decreased coordination, decreased endurance, decreased strength, and pain.   ACTIVITY LIMITATIONS: carrying, lifting, bending, standing, squatting, stairs, and locomotion level  PARTICIPATION LIMITATIONS: working out with Systems analyst, exercises such as pushups (pain in wrists), planks (pain in wrists), pain in knees- KB swings, squats, & jumping jacks   PERSONAL FACTORS:  Past/current experiences are also affecting patient's functional outcome.   REHAB POTENTIAL: Good  CLINICAL DECISION MAKING: Stable/uncomplicated  EVALUATION COMPLEXITY: Low   GOALS: Goals reviewed with patient? Yes  SHORT TERM GOALS: Target date: 01/04/2023  Pt will be independent in initial HEP Goal status: MET  2.  Pt will report ability to speed walk with less than 3/10 pain.   Goal status: INITIAL     LONG TERM GOALS: Target date: 02/15/2023  Pt will report 75% improvement in the ability to navigate stairs.  Goal status: INITIAL  2.  Pt will report ability to run for 10 minutes with <3/10 pain in her knees & feet.  Goal status: INITIAL  3.  Pt will report 75% improvement in the her knee and ankle pain with LE workout   Goal status: INITIAL  4.   Pt will demo ability to perform 20 seconds of single leg hops to promote ability to return to running.   Goal status: INITIAL     PLAN:  PT FREQUENCY: 1-2x/week  PT DURATION: 8 weeks  PLANNED INTERVENTIONS: 97110-Therapeutic exercises, 97530- Therapeutic activity, 97112- Neuromuscular re-education, 97535- Self Care, 16109- Manual therapy, [phone]- Ionotophoresis 4mg /ml Dexamethasone, Patient/Family education, Balance training, Stair training, Taping, Dry Needling, Joint mobilization, Joint manipulation, Spinal manipulation, Spinal mobilization, Cryotherapy, and Moist heat  PLAN FOR NEXT SESSION: Look to add theract to promote return to gym activities as tolerated. - patient likes verbal/tactile cues and to know where she should feel it (needs additional cues to avoid compensatory strategies).  F/u FOTO   2:04 PM, 01/31/23 Tereasa Coop, DPT Physical Therapy with [facility]

## 2023-02-02 ENCOUNTER — Encounter: Payer: Self-pay | Admitting: Physical Therapy

## 2023-02-02 ENCOUNTER — Ambulatory Visit (INDEPENDENT_AMBULATORY_CARE_PROVIDER_SITE_OTHER): Payer: 59 | Admitting: Physical Therapy

## 2023-02-02 DIAGNOSIS — M25562 Pain in left knee: Secondary | ICD-10-CM

## 2023-02-02 DIAGNOSIS — M546 Pain in thoracic spine: Secondary | ICD-10-CM

## 2023-02-02 DIAGNOSIS — M6281 Muscle weakness (generalized): Secondary | ICD-10-CM | POA: Diagnosis not present

## 2023-02-02 DIAGNOSIS — M25572 Pain in left ankle and joints of left foot: Secondary | ICD-10-CM

## 2023-02-02 DIAGNOSIS — M25511 Pain in right shoulder: Secondary | ICD-10-CM

## 2023-02-02 DIAGNOSIS — M25512 Pain in left shoulder: Secondary | ICD-10-CM

## 2023-02-02 DIAGNOSIS — M25571 Pain in right ankle and joints of right foot: Secondary | ICD-10-CM | POA: Diagnosis not present

## 2023-02-02 DIAGNOSIS — G8929 Other chronic pain: Secondary | ICD-10-CM

## 2023-02-02 DIAGNOSIS — M25561 Pain in right knee: Secondary | ICD-10-CM

## 2023-02-03 ENCOUNTER — Encounter: Payer: 59 | Admitting: Physical Therapy

## 2023-02-07 ENCOUNTER — Ambulatory Visit (INDEPENDENT_AMBULATORY_CARE_PROVIDER_SITE_OTHER): Payer: 59 | Admitting: Physical Therapy

## 2023-02-07 ENCOUNTER — Encounter: Payer: Self-pay | Admitting: Physical Therapy

## 2023-02-07 DIAGNOSIS — M25571 Pain in right ankle and joints of right foot: Secondary | ICD-10-CM | POA: Diagnosis not present

## 2023-02-07 DIAGNOSIS — M25561 Pain in right knee: Secondary | ICD-10-CM | POA: Diagnosis not present

## 2023-02-07 DIAGNOSIS — M546 Pain in thoracic spine: Secondary | ICD-10-CM

## 2023-02-07 DIAGNOSIS — M25572 Pain in left ankle and joints of left foot: Secondary | ICD-10-CM

## 2023-02-07 DIAGNOSIS — M6281 Muscle weakness (generalized): Secondary | ICD-10-CM | POA: Diagnosis not present

## 2023-02-07 DIAGNOSIS — M25562 Pain in left knee: Secondary | ICD-10-CM

## 2023-02-07 DIAGNOSIS — G8929 Other chronic pain: Secondary | ICD-10-CM

## 2023-02-07 NOTE — Therapy (Signed)
OUTPATIENT PHYSICAL THERAPY Treatment     Patient Name: Hailey Patrick MRN: [id number] DOB:11/20/1995, 27 y.o., female Today's Date: 02/07/2023   END OF SESSION:  PT End of Session - 02/07/23 1954     Visit Number 12    Number of Visits 23    Date for PT Re-Evaluation 04/28/23    Authorization Type [employer]    PT Start Time 1520    PT Stop Time 1601    PT Time Calculation (min) 41 min    Activity Tolerance Patient tolerated treatment well;No increased pain    Behavior During Therapy WFL for tasks assessed/performed                 Past Medical History:  Diagnosis Date   Allergy    Sesonal   Asthma    as a young child   Obesity    Seizures (HCC)    febrile age of 2. Stare spell- last one at 24 when menes begin   Past Surgical History:  Procedure Laterality Date   ANTERIOR CRUCIATE LIGAMENT REPAIR Right 08/29/2012   Procedure: RIGHT KNEE ANTERIOR CRUCIATE LIGAMENT RECONSTRUCTION, HAMSTRING AUTOGRAFT;  Surgeon: Cammy Copa, MD;  Location: [facility] OR;  Service: Orthopedics;  Laterality: Right;   There are no active problems to display for this patient.   PCP: Fleet Contras, MD  REFERRING PROVIDER: Ozella Rocks, MD  REFERRING DIAG: Bilateral knee, foot, and shoulder pain, thoracic pain   THERAPY DIAG:  Hypermobility of bilateral knees and ankle/feet. Muscular performance deficits of Bil Les.  Rationale for Evaluation and Treatment: Rehabilitation  ONSET DATE: Over a year ago  SUBJECTIVE:   SUBJECTIVE STATEMENT: 02/07/2023 States knees feeling very good. Feet hurt here and there with certain motions in arch of foot. Thoracic pain much improved from previous weeks.   Thoracic Spine: States she has had light pain as of late. States she has had constant upper back pain on the left side of her shoulder blade and it was like fist size. States she got dry needling, used muscle relaxer and an injection. States she had pain when she was  vertical . States she bought a weighted heated pad. Reports she wants to avoid getting back pain. States she stands and sits for work. States that she feels like her desk set up is good.   Patient is right handed.   Eval: Pt states from January 2023 until now, she lost > 100 lbs and has developed increasing pain in her knees, feet, and bilateral shoulders. Pt's foot pain started a few years ago, knee pain within the last year, and shoulder pain within last of weeks. Pt states her MD told her a probable cause was the changing of her body mechanics secondary to weight loss. Aggravating factors for her knees & feet include, stairs, jumping, running, working out, & speed walking. Alleviating factors include, Voltaren, ice, heat or Ibuprofen. Pt states she does experience N/T in her feet, but believes it may be caused by the new compression sleeves her MD told her to use. Pt had L ACL surgery 10 years ago.   PERTINENT HISTORY: Acl surgery 2014,   PAIN:  Are you having pain? Yes: NPRS scale: 4/10 Pain location: B shoulder R >L  Pain description: tight, sharp  Aggravating factors: driving long periods, holding arms up  Relieving factors: DN, horizontal      PRECAUTIONS: None  RED FLAGS: None   WEIGHT BEARING RESTRICTIONS: No  FALLS:  Has  patient fallen in last 6 months? No  LIVING ENVIRONMENT: Lives with: lives with their family Lives in: House/apartment Stairs: Yes: External: 2 floors steps; can reach both Has following equipment at home: None  OCCUPATION: works at a desk  PLOF: Independent  PATIENT GOALS: improve ability to navigate stairs, run, speed walking, jump, and workout with reduced pain/limitation  NEXT MD VISIT: February 2025  OBJECTIVE:  Note: Objective measures were completed at Evaluation unless otherwise noted.  DIAGNOSTIC FINDINGS:   PATIENT SURVEYS:  Knee  FOTO 64 (12/21/22) 12/18  81%  Predicted 76%   COGNITION: Overall cognitive status: Within  functional limits for tasks assessed     SENSATION: WFL   MUSCLE LENGTH: Hamstrings: Right WFL ; Left WFL    POSTURE: min R knee valgus and mild ankle overpronation (R>L). Bil knee hyperextension.  Waist measurement: 40.75 inches waist - Day 2 of cycle at the end of the day.   PALPATION: Tenderness to palpation along B UT and increased resting tone noted B  LOWER EXTREMITY ROM: Hip ROM: WFL ,  Knee ROM: WFL hypermobile extension bilaterally,  Ankle ROM: WFL, hypermobile inversion  LOWER EXTREMITY MMT:  MMT Right 12/18 Left 12/18  Hip flexion    Hip extension 5 5  Hip abduction 5 5  Hip adduction    Hip internal rotation    Hip external rotation    Knee flexion 5 5  Knee extension 5 5  Ankle dorsiflexion    Ankle plantarflexion    Ankle inversion    Ankle eversion     (Blank rows = not tested)    UE Measurements Upper Extremity Right 12/18 Left 12/19   A/PROM MMT A/PROM MMT  Shoulder Flexion WFL^ 4+ WFL 4*  Shoulder Extension      Shoulder Abduction  4*  4  Shoulder Adduction      Shoulder Internal Rotation Reaches to T8 SP* 4-* Reaches to T6 SP 4  Shoulder External Rotation Reaches to T6 SP 3+* Reaches to T6 SP 3+  Elbow Flexion      Elbow Extension      Wrist Flexion      Wrist Extension      Wrist Supination      Wrist Pronation      Wrist Ulnar Deviation      Wrist Radial Deviation      Grip Strength NA  NA     (Blank rows = not tested)   * pain ^ tight    FUNCTIONAL TESTS:  02/02/23 - predominantly anterior/upper chest breather  Previously tested Single Leg hop Test:  R- Pt able to hop for 10 secs before stopping d/t pain in foot.  L- Pt able to hop for 8 secs before stopping d/t pain in L knee   GAIT: Previously tested Distance walked: 25 ft Assistive device utilized: None Level of assistance: Complete Independence Comments: R dynamic knee valgus and overpronation of R foot   TODAY'S TREATMENT:  DATE:  02/07/2023 Therapeutic Exercise: Supine:  bridging x 12 with march;  x10 regular;  Quadruped: SA presses x 15;  S/L: Seated:  Standing:  step up with opp knee drive x 12 bil 6 in;  lateral band walks Blue TB 25 ft x 6;  heel raises x 15;  SLS 30 sec x 2 bil; SLS with head turns 2 x 10 bil;  Rows blue TB 2 x 10; small hops (both feet) 2 x 10;  Stretches:  cat/cow x 15;  ;  Neuromuscular Re-education:  Therapeutic Activity:   Manual Therapy: Self Care:    PATIENT EDUCATION:  Education:updated and reviewed HEP, posture,  Person educated: Patient Education method: Explanation, Demonstration, and Verbal cues Education comprehension: verbalized understanding and returned demonstration  HOME EXERCISE PROGRAM: [id number]  ASSESSMENT:  CLINICAL IMPRESSION:  02/07/2023 Pt progressing very well with knee pain. Doing most activities without pain at this time. She has mild foot pain from time to time. Reviewed single leg stance and foot position for stability today, as well as trial for lateral stepping and jumping. She has increased toe clawing and intrinsic foot activation with lateral stepping, cued for equal pressure through foot. Mild medial ankle soreness with jumping. Pt with improving thoracic pain, but still some soreness. Plan to update final HEP next visit for knees and feet, and likely d/c at that time for LEs. She will likely benefit from continued care for thoracic pain.    Eval: Patient is a 27 y.o.f who was seen today for physical therapy evaluation and treatment for bil foot and knee pain. Primary impairments include hypermobility of Bil knees and feet/ankles and muscular performance deficits of bil knee & feet/ankles. She also has bilateral shoulder pain, plan to give pt HEP for this in future sessions, but will focus on knee and ankle pain at this time. These impairments limit her  ability to run, speed walk, navigate stairs, jump, and workout without pain/limitation. Pt will benefit from skilled physical therapy to work on these impairments.  OBJECTIVE IMPAIRMENTS: decreased activity tolerance, decreased coordination, decreased endurance, decreased strength, and pain.   ACTIVITY LIMITATIONS: carrying, lifting, bending, standing, squatting, stairs, and locomotion level  PARTICIPATION LIMITATIONS: working out with Systems analyst, exercises such as pushups (pain in wrists), planks (pain in wrists), pain in knees- KB swings, squats, & jumping jacks   PERSONAL FACTORS: Past/current experiences are also affecting patient's functional outcome.   REHAB POTENTIAL: Good  CLINICAL DECISION MAKING: Stable/uncomplicated  EVALUATION COMPLEXITY: Low   GOALS: Goals reviewed with patient? Yes  SHORT TERM GOALS: Target date: 03/29/23  Pt will be independent in initial HEP Goal status: MET  2.  Pt will report ability to speed walk with less than 3/10 pain.   Goal status: MET   3.  Pt will  be able to drive up to 2 hours without unbearable thoracic pain  Goal status: NEW   4.  Patient will demonstrate painfree B shoulder ROM in all directions  Goal status: NEW    LONG TERM GOALS: Target date: 04/28/2023  Pt will report 75% improvement in the ability to navigate stairs.  Goal status: MET  2.  Pt will report ability to run for 10 minutes with <3/10 pain in her knees & feet.  Goal status: PROGRESSING  3.  Pt will report 75% improvement in the her knee and ankle pain with LE workout   Goal status: MET  4.   Pt will demo ability to perform 20 seconds of  single leg hops to promote ability to return to running.   Goal status: PROGRESSING  5.  Patient will be able to demonstrate vacuum breathing maneuver with good lower rib expansion to demonstrate reduced intra-abdominal pressure  Goal status: NEW   6.  Patient will painfree MMT in bilateral UE.  Goal  status: NEW    PLAN:  PT FREQUENCY: 1-2x/week  PT DURATION: 8 weeks  PLANNED INTERVENTIONS: 97110-Therapeutic exercises, 97530- Therapeutic activity, 97112- Neuromuscular re-education, 97535- Self Care, 35573- Manual therapy, [phone]- Ionotophoresis 4mg /ml Dexamethasone, Patient/Family education, Balance training, Stair training, Taping, Dry Needling, Joint mobilization, Joint manipulation, Spinal manipulation, Spinal mobilization, Cryotherapy, and Moist heat  PLAN FOR NEXT SESSION:  - progress ankle/LE stability exercises -progress breathing exercises and follow hypopressive protocol - add in quadruped strengthening and scapular strengthening exercises    Sedalia Muta, PT, DPT 7:59 PM  02/07/23

## 2023-02-17 ENCOUNTER — Encounter: Payer: 59 | Admitting: Physical Therapy

## 2023-02-23 ENCOUNTER — Ambulatory Visit: Payer: 59 | Admitting: Physical Therapy

## 2023-02-23 ENCOUNTER — Encounter: Payer: Self-pay | Admitting: Physical Therapy

## 2023-02-23 DIAGNOSIS — M25571 Pain in right ankle and joints of right foot: Secondary | ICD-10-CM | POA: Diagnosis not present

## 2023-02-23 DIAGNOSIS — M25572 Pain in left ankle and joints of left foot: Secondary | ICD-10-CM

## 2023-02-23 DIAGNOSIS — M6281 Muscle weakness (generalized): Secondary | ICD-10-CM

## 2023-02-23 DIAGNOSIS — M25561 Pain in right knee: Secondary | ICD-10-CM | POA: Diagnosis not present

## 2023-02-23 DIAGNOSIS — M25562 Pain in left knee: Secondary | ICD-10-CM

## 2023-02-23 DIAGNOSIS — M25512 Pain in left shoulder: Secondary | ICD-10-CM

## 2023-02-23 DIAGNOSIS — M546 Pain in thoracic spine: Secondary | ICD-10-CM

## 2023-02-23 DIAGNOSIS — M25511 Pain in right shoulder: Secondary | ICD-10-CM

## 2023-02-23 DIAGNOSIS — G8929 Other chronic pain: Secondary | ICD-10-CM

## 2023-02-23 NOTE — Therapy (Signed)
 OUTPATIENT PHYSICAL THERAPY Treatment     Patient Name: Hailey Patrick MRN: [id number] DOB:10-27-1995, 28 y.o., female Today's Date: 02/23/2023   END OF SESSION:  PT End of Session - 02/23/23 1519     Visit Number 13    Number of Visits 23    Date for PT Re-Evaluation 04/28/23    Authorization Type [employer]    PT Start Time 1520    PT Stop Time 1558    PT Time Calculation (min) 38 min    Activity Tolerance Patient tolerated treatment well;No increased pain    Behavior During Therapy WFL for tasks assessed/performed                 Past Medical History:  Diagnosis Date   Allergy    Sesonal   Asthma    as a young child   Obesity    Seizures (HCC)    febrile age of 2. Stare spell- last one at 58 when menes begin   Past Surgical History:  Procedure Laterality Date   ANTERIOR CRUCIATE LIGAMENT REPAIR Right 08/29/2012   Procedure: RIGHT KNEE ANTERIOR CRUCIATE LIGAMENT RECONSTRUCTION, HAMSTRING AUTOGRAFT;  Surgeon: Cordella Glendia Hutchinson, MD;  Location: [facility] OR;  Service: Orthopedics;  Laterality: Right;   There are no active problems to display for this patient.   PCP: Shelda Atlas, MD  REFERRING PROVIDER: Remonia Alm PARAS, MD  REFERRING DIAG: Bilateral knee, foot, and shoulder pain, thoracic pain   THERAPY DIAG:  Hypermobility of bilateral knees and ankle/feet. Muscular performance deficits of Bil Les.  Rationale for Evaluation and Treatment: Rehabilitation  ONSET DATE: Over a year ago  SUBJECTIVE:   SUBJECTIVE STATEMENT: 02/23/2023 States shoulder blade feels better. States that she had a sharp pain in her lower left abdomen and she had the same type of pain on that following Saturday. States she is now back with her trainer.  Thoracic Spine: States she has had light pain as of late. States she has had constant upper back pain on the left side of her shoulder blade and it was like fist size. States she got dry needling, used muscle relaxer  and an injection. States she had pain when she was vertical . States she bought a weighted heated pad. Reports she wants to avoid getting back pain. States she stands and sits for work. States that she feels like her desk set up is good.   Patient is right handed.   Eval: Pt states from January 2023 until now, she lost > 100 lbs and has developed increasing pain in her knees, feet, and bilateral shoulders. Pt's foot pain started a few years ago, knee pain within the last year, and shoulder pain within last of weeks. Pt states her MD told her a probable cause was the changing of her body mechanics secondary to weight loss. Aggravating factors for her knees & feet include, stairs, jumping, running, working out, & speed walking. Alleviating factors include, Voltaren, ice, heat or Ibuprofen . Pt states she does experience N/T in her feet, but believes it may be caused by the new compression sleeves her MD told her to use. Pt had L ACL surgery 10 years ago.   PERTINENT HISTORY: Acl surgery 2014,   PAIN:  Are you having pain? Yes: NPRS scale: 0/10 Pain location: B shoulder R >L  Pain description: tight, sharp  Aggravating factors: driving long periods, holding arms up  Relieving factors: DN, horizontal      PRECAUTIONS: None  RED  FLAGS: None   WEIGHT BEARING RESTRICTIONS: No  FALLS:  Has patient fallen in last 6 months? No  LIVING ENVIRONMENT: Lives with: lives with their family Lives in: House/apartment Stairs: Yes: External: 2 floors steps; can reach both Has following equipment at home: None  OCCUPATION: works at a desk  PLOF: Independent  PATIENT GOALS: improve ability to navigate stairs, run, speed walking, jump, and workout with reduced pain/limitation  NEXT MD VISIT: February 2025  OBJECTIVE:  Note: Objective measures were completed at Evaluation unless otherwise noted.  DIAGNOSTIC FINDINGS:   PATIENT SURVEYS:  Knee  FOTO 64 (12/21/22) 12/18  81%  Predicted  76%   COGNITION: Overall cognitive status: Within functional limits for tasks assessed     SENSATION: WFL   MUSCLE LENGTH: Hamstrings: Right WFL ; Left WFL    POSTURE: min R knee valgus and mild ankle overpronation (R>L). Bil knee hyperextension.  Waist measurement: 40.75 inches waist - Day 2 of cycle at the end of the day.   PALPATION: Tenderness to palpation along B UT and increased resting tone noted B  LOWER EXTREMITY ROM: Hip ROM: WFL ,  Knee ROM: WFL hypermobile extension bilaterally,  Ankle ROM: WFL, hypermobile inversion  LOWER EXTREMITY MMT:  MMT Right 12/18 Left 12/18  Hip flexion    Hip extension 5 5  Hip abduction 5 5  Hip adduction    Hip internal rotation    Hip external rotation    Knee flexion 5 5  Knee extension 5 5  Ankle dorsiflexion    Ankle plantarflexion    Ankle inversion    Ankle eversion     (Blank rows = not tested)    UE Measurements Upper Extremity Right 12/18 Left 12/19   A/PROM MMT A/PROM MMT  Shoulder Flexion WFL^ 4+ WFL 4*  Shoulder Extension      Shoulder Abduction  4*  4  Shoulder Adduction      Shoulder Internal Rotation Reaches to T8 SP* 4-* Reaches to T6 SP 4  Shoulder External Rotation Reaches to T6 SP 3+* Reaches to T6 SP 3+  Elbow Flexion      Elbow Extension      Wrist Flexion      Wrist Extension      Wrist Supination      Wrist Pronation      Wrist Ulnar Deviation      Wrist Radial Deviation      Grip Strength NA  NA     (Blank rows = not tested)   * pain ^ tight    FUNCTIONAL TESTS:  02/02/23 - predominantly anterior/upper chest breather  Previously tested Single Leg hop Test:  R- Pt able to hop for 10 secs before stopping d/t pain in foot.  L- Pt able to hop for 8 secs before stopping d/t pain in L knee   GAIT: Previously tested Distance walked: 25 ft Assistive device utilized: None Level of assistance: Complete Independence Comments: R dynamic knee valgus and overpronation of R  foot   TODAY'S TREATMENT:  DATE:  02/23/2023 Therapeutic Exercise: Supine:    Quadruped: on use of elevation for wrists with quad/planks - practiced in clinic 10 minutes Reviewed entire HEP  Prone belly breathing over pilates ball lower in t-spine -reviewed  S/L: Seated:  Standing:  Stretches:  Neuromuscular Re-education: long exhale breathing 6 minutes, posterior lateral breathing 6 minutes, Maya in different positions- 15 minutes total - prior demo- tolerated neutral arm position with elbows extended best Therapeutic Activity:   Manual Therapy: Self Care:    PATIENT EDUCATION:  Education:updated and reviewed HEP, posture, review of anatomy and rationale behind interventions. Person educated: Patient Education method: Explanation, Demonstration, and Verbal cues Education comprehension: verbalized understanding and returned demonstration  HOME EXERCISE PROGRAM: [id number]  ASSESSMENT:  CLINICAL IMPRESSION:  02/23/2023 Reviewed breathing exercises and cued for lower rib expansion. This improved with scapular protraction. Trailed quadruped position but patient with baseline wrist restrictions limiting this position. Educated patient in ways to stretch wrist to achieve quadruped position without wrist pain. Tolerated weight plates for reduced wrist extension best. Added Maya to HEP which patient demonstrate improved lower rib expansion in this position compared to supine. Overall patient doing well and will continue to benefit from skilled PT.     Eval: Patient is a 28 y.o.f who was seen today for physical therapy evaluation and treatment for bil foot and knee pain. Primary impairments include hypermobility of Bil knees and feet/ankles and muscular performance deficits of bil knee & feet/ankles. She also has bilateral shoulder pain, plan to give pt HEP for this in  future sessions, but will focus on knee and ankle pain at this time. These impairments limit her ability to run, speed walk, navigate stairs, jump, and workout without pain/limitation. Pt will benefit from skilled physical therapy to work on these impairments.  OBJECTIVE IMPAIRMENTS: decreased activity tolerance, decreased coordination, decreased endurance, decreased strength, and pain.   ACTIVITY LIMITATIONS: carrying, lifting, bending, standing, squatting, stairs, and locomotion level  PARTICIPATION LIMITATIONS: working out with systems analyst, exercises such as pushups (pain in wrists), planks (pain in wrists), pain in knees- KB swings, squats, & jumping jacks   PERSONAL FACTORS: Past/current experiences are also affecting patient's functional outcome.   REHAB POTENTIAL: Good  CLINICAL DECISION MAKING: Stable/uncomplicated  EVALUATION COMPLEXITY: Low   GOALS: Goals reviewed with patient? Yes  SHORT TERM GOALS: Target date: 03/29/23  Pt will be independent in initial HEP Goal status: MET  2.  Pt will report ability to speed walk with less than 3/10 pain.   Goal status: MET   3.  Pt will  be able to drive up to 2 hours without unbearable thoracic pain  Goal status: NEW   4.  Patient will demonstrate painfree B shoulder ROM in all directions  Goal status: NEW    LONG TERM GOALS: Target date: 04/28/2023  Pt will report 75% improvement in the ability to navigate stairs.  Goal status: MET  2.  Pt will report ability to run for 10 minutes with <3/10 pain in her knees & feet.  Goal status: PROGRESSING  3.  Pt will report 75% improvement in the her knee and ankle pain with LE workout   Goal status: MET  4.   Pt will demo ability to perform 20 seconds of single leg hops to promote ability to return to running.   Goal status: PROGRESSING  5.  Patient will be able to demonstrate vacuum breathing maneuver with good lower rib expansion to demonstrate reduced  intra-abdominal pressure  Goal status: NEW   6.  Patient will painfree MMT in bilateral UE.  Goal status: NEW    PLAN:  PT FREQUENCY: 1-2x/week  PT DURATION: 8 weeks  PLANNED INTERVENTIONS: 97110-Therapeutic exercises, 97530- Therapeutic activity, 97112- Neuromuscular re-education, 97535- Self Care, [id number]- Manual therapy, [phone]- Ionotophoresis 4mg /ml Dexamethasone , Patient/Family education, Balance training, Stair training, Taping, Dry Needling, Joint mobilization, Joint manipulation, Spinal manipulation, Spinal mobilization, Cryotherapy, and Moist heat  PLAN FOR NEXT SESSION:  - progress ankle/LE stability exercises -progress breathing exercises and follow hypopressive protocol - add in quadruped strengthening and scapular strengthening exercises    4:51 PM, 02/23/23 Olivia Church, DPT Physical Therapy with [place]

## 2023-02-23 NOTE — Patient Instructions (Addendum)
-  head flexed trying to look at pubic bone -pres hands into ground to open up between shoulder blades -fingers facing each other- if this is too much stress on the wrists - can angle hands at a diagonal

## 2023-02-24 ENCOUNTER — Encounter: Payer: 59 | Admitting: Physical Therapy

## 2023-03-02 ENCOUNTER — Ambulatory Visit (INDEPENDENT_AMBULATORY_CARE_PROVIDER_SITE_OTHER): Payer: 59 | Admitting: Physical Therapy

## 2023-03-02 ENCOUNTER — Encounter: Payer: Self-pay | Admitting: Physical Therapy

## 2023-03-02 DIAGNOSIS — M6281 Muscle weakness (generalized): Secondary | ICD-10-CM | POA: Diagnosis not present

## 2023-03-02 DIAGNOSIS — M546 Pain in thoracic spine: Secondary | ICD-10-CM

## 2023-03-02 DIAGNOSIS — M25511 Pain in right shoulder: Secondary | ICD-10-CM

## 2023-03-02 DIAGNOSIS — M25572 Pain in left ankle and joints of left foot: Secondary | ICD-10-CM | POA: Diagnosis not present

## 2023-03-02 DIAGNOSIS — M25571 Pain in right ankle and joints of right foot: Secondary | ICD-10-CM | POA: Diagnosis not present

## 2023-03-02 DIAGNOSIS — G8929 Other chronic pain: Secondary | ICD-10-CM

## 2023-03-02 DIAGNOSIS — M25561 Pain in right knee: Secondary | ICD-10-CM

## 2023-03-02 DIAGNOSIS — M25562 Pain in left knee: Secondary | ICD-10-CM

## 2023-03-02 NOTE — Patient Instructions (Signed)
  Get into tall kneeling position with goal of aligning knees, pelvis, thoracic and cervical spine  Think about spinal elongation- pressing down into toes and knees to grow tall and pressing the back of your head up towards the ceiling while keeping your head level Pelvis will be most challenging to align for most people due to tightness in the hips - it is OK to not have this perfectly aligned at first while focusing on spinal elongation and upper thoracic expansion Arms - can be placed on a wall in front of you for both resistance and support - remember to push through your hands away from your chest WHILE pressing you elbows wide - pretend that you are in a wine barrel that is about to collapse and you are pressing out in all directions (hands/elbows/upper back) to keep it from collapsing

## 2023-03-02 NOTE — Therapy (Signed)
 OUTPATIENT PHYSICAL THERAPY Treatment     Patient Name: Hailey Patrick MRN: [id number] DOB:May 31, 1995, 28 y.o., female Today's Date: 03/02/2023   END OF SESSION:  PT End of Session - 03/02/23 1516     Visit Number 14    Number of Visits 23    Date for PT Re-Evaluation 04/28/23    Authorization Type [employer]    PT Start Time 1515    PT Stop Time 1602    PT Time Calculation (min) 47 min    Activity Tolerance Patient tolerated treatment well;No increased pain    Behavior During Therapy WFL for tasks assessed/performed                 Past Medical History:  Diagnosis Date   Allergy    Sesonal   Asthma    as a young child   Obesity    Seizures (HCC)    febrile age of 2. Stare spell- last one at 89 when menes begin   Past Surgical History:  Procedure Laterality Date   ANTERIOR CRUCIATE LIGAMENT REPAIR Right 08/29/2012   Procedure: RIGHT KNEE ANTERIOR CRUCIATE LIGAMENT RECONSTRUCTION, HAMSTRING AUTOGRAFT;  Surgeon: Jasmine Mesi, MD;  Location: [facility] OR;  Service: Orthopedics;  Laterality: Right;   There are no active problems to display for this patient.   PCP: Charle Congo, MD  REFERRING PROVIDER: Otilia Bloch, MD  REFERRING DIAG: Bilateral knee, foot, and shoulder pain, thoracic pain   THERAPY DIAG:  Hypermobility of bilateral knees and ankle/feet. Muscular performance deficits of Bil Les.  Rationale for Evaluation and Treatment: Rehabilitation  ONSET DATE: Over a year ago  SUBJECTIVE:   SUBJECTIVE STATEMENT: 03/02/2023 Feeling good. No complaints, has been practicing her breathing exercises.  Thoracic Spine: States she has had light pain as of late. States she has had constant upper back pain on the left side of her shoulder blade and it was like fist size. States she got dry needling, used muscle relaxer and an injection. States she had pain when she was vertical . States she bought a weighted heated pad. Reports she wants to  avoid getting back pain. States she stands and sits for work. States that she feels like her desk set up is good.   Patient is right handed.   Eval: Pt states from January 2023 until now, she lost > 100 lbs and has developed increasing pain in her knees, feet, and bilateral shoulders. Pt's foot pain started a few years ago, knee pain within the last year, and shoulder pain within last of weeks. Pt states her MD told her a probable cause was the changing of her body mechanics secondary to weight loss. Aggravating factors for her knees & feet include, stairs, jumping, running, working out, & speed walking. Alleviating factors include, Voltaren, ice, heat or Ibuprofen . Pt states she does experience N/T in her feet, but believes it may be caused by the new compression sleeves her MD told her to use. Pt had L ACL surgery 10 years ago.   PERTINENT HISTORY: Acl surgery 2014,   PAIN:  Are you having pain? Yes: NPRS scale: 0/10 Pain location: B shoulder R >L  Pain description: tight, sharp  Aggravating factors: driving long periods, holding arms up  Relieving factors: DN, horizontal      PRECAUTIONS: None  RED FLAGS: None   WEIGHT BEARING RESTRICTIONS: No  FALLS:  Has patient fallen in last 6 months? No  LIVING ENVIRONMENT: Lives with: lives with their  family Lives in: House/apartment Stairs: Yes: External: 2 floors steps; can reach both Has following equipment at home: None  OCCUPATION: works at a desk  PLOF: Independent  PATIENT GOALS: improve ability to navigate stairs, run, speed walking, jump, and workout with reduced pain/limitation  NEXT MD VISIT: February 2025  OBJECTIVE:  Note: Objective measures were completed at Evaluation unless otherwise noted.  DIAGNOSTIC FINDINGS:   PATIENT SURVEYS:  Knee  FOTO 64 (12/21/22) 12/18  81%  Predicted 76%   COGNITION: Overall cognitive status: Within functional limits for tasks assessed     SENSATION: WFL   MUSCLE  LENGTH: Hamstrings: Right WFL ; Left WFL    POSTURE: min R knee valgus and mild ankle overpronation (R>L). Bil knee hyperextension.  Waist measurement: 40.75 inches waist - Day 2 of cycle at the end of the day.   PALPATION: Tenderness to palpation along B UT and increased resting tone noted B  LOWER EXTREMITY ROM: Hip ROM: WFL ,  Knee ROM: WFL hypermobile extension bilaterally,  Ankle ROM: WFL, hypermobile inversion  LOWER EXTREMITY MMT:  MMT Right 12/18 Left 12/18  Hip flexion    Hip extension 5 5  Hip abduction 5 5  Hip adduction    Hip internal rotation    Hip external rotation    Knee flexion 5 5  Knee extension 5 5  Ankle dorsiflexion    Ankle plantarflexion    Ankle inversion    Ankle eversion     (Blank rows = not tested)    UE Measurements Upper Extremity Right 12/18 Left 12/19   A/PROM MMT A/PROM MMT  Shoulder Flexion WFL^ 4+ WFL 4*  Shoulder Extension      Shoulder Abduction  4*  4  Shoulder Adduction      Shoulder Internal Rotation Reaches to T8 SP* 4-* Reaches to T6 SP 4  Shoulder External Rotation Reaches to T6 SP 3+* Reaches to T6 SP 3+  Elbow Flexion      Elbow Extension      Wrist Flexion      Wrist Extension      Wrist Supination      Wrist Pronation      Wrist Ulnar Deviation      Wrist Radial Deviation      Grip Strength NA  NA     (Blank rows = not tested)   * pain ^ tight    FUNCTIONAL TESTS:  02/02/23 - predominantly anterior/upper chest breather  Previously tested Single Leg hop Test:  R- Pt able to hop for 10 secs before stopping d/t pain in foot.  L- Pt able to hop for 8 secs before stopping d/t pain in L knee   GAIT: Previously tested Distance walked: 25 ft Assistive device utilized: None Level of assistance: Complete Independence Comments: R dynamic knee valgus and overpronation of R foot   TODAY'S TREATMENT:  DATE:  03/02/2023 Therapeutic Exercise: Supine:    Quadruped: on use of elevation for wrists with quad/planks - practiced in clinic 10 minutes Reviewed entire HEP  Prone belly breathing over pilates ball lower in t-spine -reviewed  S/L: Seated:  Standing:  Stretches:  Neuromuscular Re-education: l Supine: long exhale/posterior lateral rib expansion 10 minutes--> progressed to hypopressive vacuum- 10 minutes Kneeling: aura position - 15 minutes breathing 360 and progressed to vacuum maneuver Quadruped: mya position on forearms practiced x10 breaths with 2 vacuuums  Therapeutic Activity:   Manual Therapy: Self Care:    PATIENT EDUCATION:  Education:updated and reviewed HEP, posture, review of anatomy and rationale behind interventions. Person educated: Patient Education method: Explanation, Demonstration, and Verbal cues Education comprehension: verbalized understanding and returned demonstration  HOME EXERCISE PROGRAM: [id number]  ASSESSMENT:  CLINICAL IMPRESSION:  03/02/2023 Session focused on learning how to further expand lateral costal breathing in different positions while working on spinal elongation for improved posture and postural awareness. Added in vacuum maneuver to progress posture re-training and this was tolerated well. Able to perform moderate vacuum in supine but difficult for patient to achieve same movement in upright position. Added new exercises to HEP and will continue with current POC.     Eval: Patient is a 28 y.o.f who was seen today for physical therapy evaluation and treatment for bil foot and knee pain. Primary impairments include hypermobility of Bil knees and feet/ankles and muscular performance deficits of bil knee & feet/ankles. She also has bilateral shoulder pain, plan to give pt HEP for this in future sessions, but will focus on knee and ankle pain at this time. These impairments limit her ability to run, speed walk, navigate stairs,  jump, and workout without pain/limitation. Pt will benefit from skilled physical therapy to work on these impairments.  OBJECTIVE IMPAIRMENTS: decreased activity tolerance, decreased coordination, decreased endurance, decreased strength, and pain.   ACTIVITY LIMITATIONS: carrying, lifting, bending, standing, squatting, stairs, and locomotion level  PARTICIPATION LIMITATIONS: working out with Systems analyst, exercises such as pushups (pain in wrists), planks (pain in wrists), pain in knees- KB swings, squats, & jumping jacks   PERSONAL FACTORS: Past/current experiences are also affecting patient's functional outcome.   REHAB POTENTIAL: Good  CLINICAL DECISION MAKING: Stable/uncomplicated  EVALUATION COMPLEXITY: Low   GOALS: Goals reviewed with patient? Yes  SHORT TERM GOALS: Target date: 03/29/23  Pt will be independent in initial HEP Goal status: MET  2.  Pt will report ability to speed walk with less than 3/10 pain.   Goal status: MET   3.  Pt will  be able to drive up to 2 hours without unbearable thoracic pain  Goal status: NEW   4.  Patient will demonstrate painfree B shoulder ROM in all directions  Goal status: NEW    LONG TERM GOALS: Target date: 04/28/2023  Pt will report 75% improvement in the ability to navigate stairs.  Goal status: MET  2.  Pt will report ability to run for 10 minutes with <3/10 pain in her knees & feet.  Goal status: PROGRESSING  3.  Pt will report 75% improvement in the her knee and ankle pain with LE workout   Goal status: MET  4.   Pt will demo ability to perform 20 seconds of single leg hops to promote ability to return to running.   Goal status: PROGRESSING  5.  Patient will be able to demonstrate vacuum breathing maneuver with good lower rib expansion to demonstrate reduced intra-abdominal pressure  Goal status: NEW   6.  Patient will painfree MMT in bilateral UE.  Goal status: NEW    PLAN:  PT FREQUENCY:  1-2x/week  PT DURATION: 8 weeks  PLANNED INTERVENTIONS: 97110-Therapeutic exercises, 97530- Therapeutic activity, 97112- Neuromuscular re-education, 97535- Self Care, 95284- Manual therapy, [phone]- Ionotophoresis 4mg /ml Dexamethasone , Patient/Family education, Balance training, Stair training, Taping, Dry Needling, Joint mobilization, Joint manipulation, Spinal manipulation, Spinal mobilization, Cryotherapy, and Moist heat  PLAN FOR NEXT SESSION:  - progress ankle/LE stability exercises -progress breathing exercises and follow hypopressive protocol - add in quadruped strengthening and scapular strengthening exercises    4:18 PM, 03/02/23 Tabitha Ewings, DPT Physical Therapy with [place]

## 2023-03-09 ENCOUNTER — Ambulatory Visit (INDEPENDENT_AMBULATORY_CARE_PROVIDER_SITE_OTHER): Payer: 59 | Admitting: Physical Therapy

## 2023-03-09 ENCOUNTER — Encounter: Payer: Self-pay | Admitting: Physical Therapy

## 2023-03-09 DIAGNOSIS — M25571 Pain in right ankle and joints of right foot: Secondary | ICD-10-CM

## 2023-03-09 DIAGNOSIS — G8929 Other chronic pain: Secondary | ICD-10-CM

## 2023-03-09 DIAGNOSIS — M25561 Pain in right knee: Secondary | ICD-10-CM

## 2023-03-09 DIAGNOSIS — M25562 Pain in left knee: Secondary | ICD-10-CM

## 2023-03-09 DIAGNOSIS — M25572 Pain in left ankle and joints of left foot: Secondary | ICD-10-CM

## 2023-03-09 DIAGNOSIS — M6281 Muscle weakness (generalized): Secondary | ICD-10-CM

## 2023-03-09 DIAGNOSIS — M546 Pain in thoracic spine: Secondary | ICD-10-CM

## 2023-03-09 NOTE — Therapy (Signed)
OUTPATIENT PHYSICAL THERAPY Treatment     Patient Name: Hailey Patrick MRN: [id number] DOB:April 05, 1995, 28 y.o., female Today's Date: 03/09/2023   END OF SESSION:  PT End of Session - 03/09/23 1258     Visit Number 15    Number of Visits 23    Date for PT Re-Evaluation 04/28/23    Authorization Type [employer]    PT Start Time 1300    PT Stop Time 1344    PT Time Calculation (min) 44 min    Activity Tolerance Patient tolerated treatment well;No increased pain    Behavior During Therapy WFL for tasks assessed/performed                 Past Medical History:  Diagnosis Date   Allergy    Sesonal   Asthma    as a young child   Obesity    Seizures (HCC)    febrile age of 2. Stare spell- last one at 67 when menes begin   Past Surgical History:  Procedure Laterality Date   ANTERIOR CRUCIATE LIGAMENT REPAIR Right 08/29/2012   Procedure: RIGHT KNEE ANTERIOR CRUCIATE LIGAMENT RECONSTRUCTION, HAMSTRING AUTOGRAFT;  Surgeon: Cammy Copa, MD;  Location: [facility] OR;  Service: Orthopedics;  Laterality: Right;   There are no active problems to display for this patient.   PCP: Fleet Contras, MD  REFERRING PROVIDER: Ozella Rocks, MD  REFERRING DIAG: Bilateral knee, foot, and shoulder pain, thoracic pain   THERAPY DIAG:  Hypermobility of bilateral knees and ankle/feet. Muscular performance deficits of Bil Les.  Rationale for Evaluation and Treatment: Rehabilitation  ONSET DATE: Over a year ago  SUBJECTIVE:   SUBJECTIVE STATEMENT: 03/09/2023 Been feeling good just muscle soreness in the shoulders. States her exercises getting easier and she feels taller and better posture.    Eval: Pt states from January 2023 until now, she lost > 100 lbs and has developed increasing pain in her knees, feet, and bilateral shoulders. Pt's foot pain started a few years ago, knee pain within the last year, and shoulder pain within last of weeks. Pt states her MD told  her a probable cause was the changing of her body mechanics secondary to weight loss. Aggravating factors for her knees & feet include, stairs, jumping, running, working out, & speed walking. Alleviating factors include, Voltaren, ice, heat or Ibuprofen. Pt states she does experience N/T in her feet, but believes it may be caused by the new compression sleeves her MD told her to use. Pt had L ACL surgery 10 years ago.   PERTINENT HISTORY: Acl surgery 2014,   PAIN:  Are you having pain? Yes: NPRS scale: 0/10 Pain location: B shoulder R >L  Pain description: tight, sharp  Aggravating factors: driving long periods, holding arms up  Relieving factors: DN, horizontal      PRECAUTIONS: None  RED FLAGS: None   WEIGHT BEARING RESTRICTIONS: No  FALLS:  Has patient fallen in last 6 months? No  LIVING ENVIRONMENT: Lives with: lives with their family Lives in: House/apartment Stairs: Yes: External: 2 floors steps; can reach both Has following equipment at home: None  OCCUPATION: works at a desk  PLOF: Independent  PATIENT GOALS: improve ability to navigate stairs, run, speed walking, jump, and workout with reduced pain/limitation  NEXT MD VISIT: February 2025  OBJECTIVE:  Note: Objective measures were completed at Evaluation unless otherwise noted.  DIAGNOSTIC FINDINGS:   PATIENT SURVEYS:  Knee  FOTO 64 (12/21/22) 12/18  81%  Predicted 76%   COGNITION: Overall cognitive status: Within functional limits for tasks assessed     SENSATION: WFL   MUSCLE LENGTH: Hamstrings: Right WFL ; Left WFL    POSTURE: min R knee valgus and mild ankle overpronation (R>L). Bil knee hyperextension.  Waist measurement: 40.75 inches waist - Day 2 of cycle at the end of the day.   PALPATION: Tenderness to palpation along B UT and increased resting tone noted B  LOWER EXTREMITY ROM: Hip ROM: WFL ,  Knee ROM: WFL hypermobile extension bilaterally,  Ankle ROM: WFL, hypermobile  inversion  LOWER EXTREMITY MMT:  MMT Right 12/18 Left 12/18  Hip flexion    Hip extension 5 5  Hip abduction 5 5  Hip adduction    Hip internal rotation    Hip external rotation    Knee flexion 5 5  Knee extension 5 5  Ankle dorsiflexion    Ankle plantarflexion    Ankle inversion    Ankle eversion     (Blank rows = not tested)    UE Measurements Upper Extremity Right 12/18 Left 12/19   A/PROM MMT A/PROM MMT  Shoulder Flexion WFL^ 4+ WFL 4*  Shoulder Extension      Shoulder Abduction  4*  4  Shoulder Adduction      Shoulder Internal Rotation Reaches to T8 SP* 4-* Reaches to T6 SP 4  Shoulder External Rotation Reaches to T6 SP 3+* Reaches to T6 SP 3+  Elbow Flexion      Elbow Extension      Wrist Flexion      Wrist Extension      Wrist Supination      Wrist Pronation      Wrist Ulnar Deviation      Wrist Radial Deviation      Grip Strength NA  NA     (Blank rows = not tested)   * pain ^ tight    FUNCTIONAL TESTS:  02/02/23 - predominantly anterior/upper chest breather  Previously tested Single Leg hop Test:  R- Pt able to hop for 10 secs before stopping d/t pain in foot.  L- Pt able to hop for 8 secs before stopping d/t pain in L knee   GAIT: Previously tested Distance walked: 25 ft Assistive device utilized: None Level of assistance: Complete Independence Comments: R dynamic knee valgus and overpronation of R foot   TODAY'S TREATMENT:                                                                                                                              DATE:  03/09/2023 Therapeutic Exercise: Supine:    Prone  S/L: Seated: self mobilization to arch of right foot 4 minutes  Standing:  Stretches:  Neuromuscular Re-education:  Supine: long exhale/posterior lateral rib expansion  -> to hypopressive vacuum-  -focused on full core engagement 10 minutes Kneeling: aura position - 10 minutes breathing 360 and progressed to vacuum  maneuver Quadruped: mya position on forearms 10 minutes Hestia:10 minutes - verbal and tactile cues- long sitting with knees bent Therapeutic Activity:   Manual Therapy: Self Care:    PATIENT EDUCATION:  Education:updated and reviewed HEP, posture, review of anatomy and rationale behind interventions. Person educated: Patient Education method: Explanation, Demonstration, and Verbal cues Education comprehension: verbalized understanding and returned demonstration  HOME EXERCISE PROGRAM: [id number]  ASSESSMENT:  CLINICAL IMPRESSION:  03/09/2023 Session focused on review of previous breathing exercises and cues to improve form.  Patient with increased intra-abdominal pressure with exhale, cued patient to zipper core and perform improved significantly with reduced intra-abdominal pressure.  Added seated position to home program.  Did not tolerate crisscross leg well but did not tolerate long sitting with knees bent much better.  No pain noted during or after session but slight soreness in shoulders after muscle activation.  Will continue with current plan of care as tolerated.  Eval: Patient is a 28 y.o.f who was seen today for physical therapy evaluation and treatment for bil foot and knee pain. Primary impairments include hypermobility of Bil knees and feet/ankles and muscular performance deficits of bil knee & feet/ankles. She also has bilateral shoulder pain, plan to give pt HEP for this in future sessions, but will focus on knee and ankle pain at this time. These impairments limit her ability to run, speed walk, navigate stairs, jump, and workout without pain/limitation. Pt will benefit from skilled physical therapy to work on these impairments.  OBJECTIVE IMPAIRMENTS: decreased activity tolerance, decreased coordination, decreased endurance, decreased strength, and pain.   ACTIVITY LIMITATIONS: carrying, lifting, bending, standing, squatting, stairs, and locomotion level  PARTICIPATION  LIMITATIONS: working out with Systems analyst, exercises such as pushups (pain in wrists), planks (pain in wrists), pain in knees- KB swings, squats, & jumping jacks   PERSONAL FACTORS: Past/current experiences are also affecting patient's functional outcome.   REHAB POTENTIAL: Good  CLINICAL DECISION MAKING: Stable/uncomplicated  EVALUATION COMPLEXITY: Low   GOALS: Goals reviewed with patient? Yes  SHORT TERM GOALS: Target date: 03/29/23  Pt will be independent in initial HEP Goal status: MET  2.  Pt will report ability to speed walk with less than 3/10 pain.   Goal status: MET   3.  Pt will  be able to drive up to 2 hours without unbearable thoracic pain  Goal status: NEW   4.  Patient will demonstrate painfree B shoulder ROM in all directions  Goal status: NEW    LONG TERM GOALS: Target date: 04/28/2023  Pt will report 75% improvement in the ability to navigate stairs.  Goal status: MET  2.  Pt will report ability to run for 10 minutes with <3/10 pain in her knees & feet.  Goal status: PROGRESSING  3.  Pt will report 75% improvement in the her knee and ankle pain with LE workout   Goal status: MET  4.   Pt will demo ability to perform 20 seconds of single leg hops to promote ability to return to running.   Goal status: PROGRESSING  5.  Patient will be able to demonstrate vacuum breathing maneuver with good lower rib expansion to demonstrate reduced intra-abdominal pressure  Goal status: NEW   6.  Patient will painfree MMT in bilateral UE.  Goal status: NEW    PLAN:  PT FREQUENCY: 1-2x/week  PT DURATION: 8 weeks  PLANNED INTERVENTIONS: 97110-Therapeutic exercises, 97530- Therapeutic activity, [id number]- Neuromuscular re-education, 97535- Self Care, 86578- Manual therapy, 97033- Ionotophoresis 4mg /ml Dexamethasone,  Patient/Family education, Balance training, Stair training, Taping, Dry Needling, Joint mobilization, Joint manipulation, Spinal  manipulation, Spinal mobilization, Cryotherapy, and Moist heat  PLAN FOR NEXT SESSION:  - progress ankle/LE stability exercises -progress breathing exercises and follow hypopressive protocol - add in quadruped strengthening and scapular strengthening exercises    2:35 PM, 03/09/23 Tereasa Coop, DPT Physical Therapy with [facility]

## 2023-03-23 ENCOUNTER — Encounter: Payer: 59 | Admitting: Physical Therapy

## 2023-03-30 ENCOUNTER — Ambulatory Visit (INDEPENDENT_AMBULATORY_CARE_PROVIDER_SITE_OTHER): Payer: 59 | Admitting: Physical Therapy

## 2023-03-30 ENCOUNTER — Encounter: Payer: Self-pay | Admitting: Physical Therapy

## 2023-03-30 DIAGNOSIS — M6281 Muscle weakness (generalized): Secondary | ICD-10-CM

## 2023-03-30 DIAGNOSIS — G8929 Other chronic pain: Secondary | ICD-10-CM

## 2023-03-30 DIAGNOSIS — M25571 Pain in right ankle and joints of right foot: Secondary | ICD-10-CM

## 2023-03-30 DIAGNOSIS — M546 Pain in thoracic spine: Secondary | ICD-10-CM

## 2023-03-30 DIAGNOSIS — M25561 Pain in right knee: Secondary | ICD-10-CM

## 2023-03-30 DIAGNOSIS — M25562 Pain in left knee: Secondary | ICD-10-CM

## 2023-03-30 DIAGNOSIS — M542 Cervicalgia: Secondary | ICD-10-CM | POA: Diagnosis not present

## 2023-03-30 DIAGNOSIS — M25572 Pain in left ankle and joints of left foot: Secondary | ICD-10-CM

## 2023-03-30 NOTE — Therapy (Signed)
OUTPATIENT PHYSICAL THERAPY Treatment     Patient Name: Hailey Patrick MRN: [id number] DOB:01/23/1996, 28 y.o., female Today's Date: 03/30/2023   END OF SESSION:  PT End of Session - 03/30/23 1432     Visit Number 16    Number of Visits 23    Date for PT Re-Evaluation 04/28/23    Authorization Type [employer]    PT Start Time 1433    PT Stop Time 1516    PT Time Calculation (min) 43 min    Activity Tolerance Patient tolerated treatment well;No increased pain    Behavior During Therapy WFL for tasks assessed/performed                 Past Medical History:  Diagnosis Date   Allergy    Sesonal   Asthma    as a young child   Obesity    Seizures (HCC)    febrile age of 2. Stare spell- last one at 17 when menes begin   Past Surgical History:  Procedure Laterality Date   ANTERIOR CRUCIATE LIGAMENT REPAIR Right 08/29/2012   Procedure: RIGHT KNEE ANTERIOR CRUCIATE LIGAMENT RECONSTRUCTION, HAMSTRING AUTOGRAFT;  Surgeon: Cammy Copa, MD;  Location: [facility] OR;  Service: Orthopedics;  Laterality: Right;   There are no active problems to display for this patient.   PCP: Fleet Contras, MD  REFERRING PROVIDER: Ozella Rocks, MD  REFERRING DIAG: Bilateral knee, foot, and shoulder pain, thoracic pain   THERAPY DIAG:  Hypermobility of bilateral knees and ankle/feet. Muscular performance deficits of Bil Les.  Rationale for Evaluation and Treatment: Rehabilitation  ONSET DATE: Over a year ago  SUBJECTIVE:   SUBJECTIVE STATEMENT: 03/30/2023 States she was helping her friend move a large TV, States she caught her right ankle on a box and her left ankle was swollen for 3 days. States she went to the MD and  has been doing better since Sunday. Swelling is much better. States that she has some residual back soreness from the fall.    Eval: Pt states from January 2023 until now, she lost > 100 lbs and has developed increasing pain in her knees, feet,  and bilateral shoulders. Pt's foot pain started a few years ago, knee pain within the last year, and shoulder pain within last of weeks. Pt states her MD told her a probable cause was the changing of her body mechanics secondary to weight loss. Aggravating factors for her knees & feet include, stairs, jumping, running, working out, & speed walking. Alleviating factors include, Voltaren, ice, heat or Ibuprofen. Pt states she does experience N/T in her feet, but believes it may be caused by the new compression sleeves her MD told her to use. Pt had L ACL surgery 10 years ago.   PERTINENT HISTORY: Acl surgery 2014,   PAIN:  Are you having pain? Yes: NPRS scale: 2/10 Pain location: mid back  Pain description: tender, sore  Aggravating factors: driving long periods, holding arms up  Relieving factors: DN, horizontal      PRECAUTIONS: None  RED FLAGS: None   WEIGHT BEARING RESTRICTIONS: No  FALLS:  Has patient fallen in last 6 months? No  LIVING ENVIRONMENT: Lives with: lives with their family Lives in: House/apartment Stairs: Yes: External: 2 floors steps; can reach both Has following equipment at home: None  OCCUPATION: works at a desk  PLOF: Independent  PATIENT GOALS: improve ability to navigate stairs, run, speed walking, jump, and workout with reduced pain/limitation  NEXT  MD VISIT: February 2025  OBJECTIVE:  Note: Objective measures were completed at Evaluation unless otherwise noted.  DIAGNOSTIC FINDINGS:   PATIENT SURVEYS:  Knee  FOTO 64 (12/21/22) 12/18  81%  Predicted 76%   COGNITION: Overall cognitive status: Within functional limits for tasks assessed     SENSATION: WFL   MUSCLE LENGTH: Hamstrings: Right WFL ; Left WFL    POSTURE: min R knee valgus and mild ankle overpronation (R>L). Bil knee hyperextension.  Waist measurement: 40.75 inches waist - Day 2 of cycle at the end of the day.   PALPATION: Tenderness to palpation along B UT and  increased resting tone noted B  LOWER EXTREMITY ROM: Hip ROM: WFL ,  Knee ROM: WFL hypermobile extension bilaterally,  Ankle ROM: WFL, hypermobile inversion  LOWER EXTREMITY MMT:  MMT Right 12/18 Left 12/18  Hip flexion    Hip extension 5 5  Hip abduction 5 5  Hip adduction    Hip internal rotation    Hip external rotation    Knee flexion 5 5  Knee extension 5 5  Ankle dorsiflexion    Ankle plantarflexion    Ankle inversion    Ankle eversion     (Blank rows = not tested)    UE Measurements Upper Extremity Right 12/18 Left 12/19   A/PROM MMT A/PROM MMT  Shoulder Flexion WFL^ 4+ WFL 4*  Shoulder Extension      Shoulder Abduction  4*  4  Shoulder Adduction      Shoulder Internal Rotation Reaches to T8 SP* 4-* Reaches to T6 SP 4  Shoulder External Rotation Reaches to T6 SP 3+* Reaches to T6 SP 3+  Elbow Flexion      Elbow Extension      Wrist Flexion      Wrist Extension      Wrist Supination      Wrist Pronation      Wrist Ulnar Deviation      Wrist Radial Deviation      Grip Strength NA  NA     (Blank rows = not tested)   * pain ^ tight    FUNCTIONAL TESTS:  02/02/23 - predominantly anterior/upper chest breather  Previously tested Single Leg hop Test:  R- Pt able to hop for 10 secs before stopping d/t pain in foot.  L- Pt able to hop for 8 secs before stopping d/t pain in L knee   GAIT: Previously tested Distance walked: 25 ft Assistive device utilized: None Level of assistance: Complete Independence Comments: R dynamic knee valgus and overpronation of R foot   TODAY'S TREATMENT:                                                                                                                              DATE:  03/30/2023 Therapeutic Exercise: Review of HEP/stretches/strengthening and soft tissue mobilization 20 minutes    Prone laying over large ball 5 minutes  S/L: Seated:  Standing:  Stretches:  Neuromuscular Re-education:  Supine:  posterolateral breathing - cues to hold weight for improved serratus activation 5 minutes Kneeling: aura position - 10 minutes breathing 360 and progressed to vacuum maneuver- tactile support with wall for cues  Therapeutic Activity:   Manual Therapy: Self Care:    PATIENT EDUCATION:  Education:HEP, posture, anatomy, cues for form, different between strengthening, stretches, and soft tissue mobilization as well as frequency Person educated: Patient Education method: Explanation, Demonstration, and Verbal cues Education comprehension: verbalized understanding and returned demonstration  HOME EXERCISE PROGRAM: [id number]  ASSESSMENT:  CLINICAL IMPRESSION:  03/30/2023 Session focused on answering all questions. Reviewed HEP and discussed types of exercises/interventions and importance of incorporating them into plan and frequency of different types of exercises. No pain noted end of session. Overall patient doing well and will continue with current POC as tolerated.   Eval: Patient is a 28 y.o.f who was seen today for physical therapy evaluation and treatment for bil foot and knee pain. Primary impairments include hypermobility of Bil knees and feet/ankles and muscular performance deficits of bil knee & feet/ankles. She also has bilateral shoulder pain, plan to give pt HEP for this in future sessions, but will focus on knee and ankle pain at this time. These impairments limit her ability to run, speed walk, navigate stairs, jump, and workout without pain/limitation. Pt will benefit from skilled physical therapy to work on these impairments.  OBJECTIVE IMPAIRMENTS: decreased activity tolerance, decreased coordination, decreased endurance, decreased strength, and pain.   ACTIVITY LIMITATIONS: carrying, lifting, bending, standing, squatting, stairs, and locomotion level  PARTICIPATION LIMITATIONS: working out with Systems analyst, exercises such as pushups (pain in wrists), planks (pain in  wrists), pain in knees- KB swings, squats, & jumping jacks   PERSONAL FACTORS: Past/current experiences are also affecting patient's functional outcome.   REHAB POTENTIAL: Good  CLINICAL DECISION MAKING: Stable/uncomplicated  EVALUATION COMPLEXITY: Low   GOALS: Goals reviewed with patient? Yes  SHORT TERM GOALS: Target date: 03/29/23  Pt will be independent in initial HEP Goal status: MET  2.  Pt will report ability to speed walk with less than 3/10 pain.   Goal status: MET   3.  Pt will  be able to drive up to 2 hours without unbearable thoracic pain  Goal status: NEW   4.  Patient will demonstrate painfree B shoulder ROM in all directions  Goal status: NEW    LONG TERM GOALS: Target date: 04/28/2023  Pt will report 75% improvement in the ability to navigate stairs.  Goal status: MET  2.  Pt will report ability to run for 10 minutes with <3/10 pain in her knees & feet.  Goal status: PROGRESSING  3.  Pt will report 75% improvement in the her knee and ankle pain with LE workout   Goal status: MET  4.   Pt will demo ability to perform 20 seconds of single leg hops to promote ability to return to running.   Goal status: PROGRESSING  5.  Patient will be able to demonstrate vacuum breathing maneuver with good lower rib expansion to demonstrate reduced intra-abdominal pressure  Goal status: NEW   6.  Patient will painfree MMT in bilateral UE.  Goal status: NEW    PLAN:  PT FREQUENCY: 1-2x/week  PT DURATION: 8 weeks  PLANNED INTERVENTIONS: 97110-Therapeutic exercises, 97530- Therapeutic activity, 97112- Neuromuscular re-education, 97535- Self Care, 44034- Manual therapy, [phone]- Ionotophoresis 4mg /ml Dexamethasone, Patient/Family education, Balance training, Stair training, Taping, Dry Needling, Joint mobilization,  Joint manipulation, Spinal manipulation, Spinal mobilization, Cryotherapy, and Moist heat  PLAN FOR NEXT SESSION:  - progress ankle/LE stability  exercises -progress breathing exercises and follow hypopressive protocol - add in quadruped strengthening and scapular strengthening exercises    3:34 PM, 03/30/23 Tereasa Coop, DPT Physical Therapy with [facility]

## 2023-03-30 NOTE — Patient Instructions (Signed)
  Get into tall kneeling position with goal of aligning knees, pelvis, thoracic and cervical spine  Think about spinal elongation- pressing down into toes and knees to grow tall and pressing the back of your head up towards the ceiling while keeping your head level Pelvis will be most challenging to align for most people due to tightness in the hips - it is OK to not have this perfectly aligned at first while focusing on spinal elongation and upper thoracic expansion Arms - can be placed on a wall in front of you for both resistance and support - remember to push through your hands away from your chest WHILE pressing you elbows wide - pretend that you are in a wine barrel that is about to collapse and you are pressing out in all directions (hands/elbows/upper back) to keep it from collapsing

## 2023-04-06 ENCOUNTER — Encounter: Payer: 59 | Admitting: Physical Therapy

## 2023-04-13 ENCOUNTER — Ambulatory Visit (INDEPENDENT_AMBULATORY_CARE_PROVIDER_SITE_OTHER): Payer: 59 | Admitting: Physical Therapy

## 2023-04-13 ENCOUNTER — Encounter: Payer: Self-pay | Admitting: Physical Therapy

## 2023-04-13 DIAGNOSIS — M6281 Muscle weakness (generalized): Secondary | ICD-10-CM

## 2023-04-13 DIAGNOSIS — M25561 Pain in right knee: Secondary | ICD-10-CM | POA: Diagnosis not present

## 2023-04-13 DIAGNOSIS — M546 Pain in thoracic spine: Secondary | ICD-10-CM | POA: Diagnosis not present

## 2023-04-13 DIAGNOSIS — M25571 Pain in right ankle and joints of right foot: Secondary | ICD-10-CM

## 2023-04-13 DIAGNOSIS — M542 Cervicalgia: Secondary | ICD-10-CM

## 2023-04-13 DIAGNOSIS — G8929 Other chronic pain: Secondary | ICD-10-CM

## 2023-04-13 DIAGNOSIS — M25562 Pain in left knee: Secondary | ICD-10-CM

## 2023-04-13 NOTE — Therapy (Signed)
 OUTPATIENT PHYSICAL THERAPY Treatment     Patient Name: Hailey Patrick MRN: [id number] DOB:May 28, 1995, 28 y.o., female Today's Date: 04/13/2023   END OF SESSION:  PT End of Session - 04/13/23 1437     Visit Number 17    Number of Visits 23    Date for PT Re-Evaluation 04/28/23    Authorization Type [employer]    PT Start Time 1437   late to check in   PT Stop Time 1515    PT Time Calculation (min) 38 min    Activity Tolerance Patient tolerated treatment well;No increased pain    Behavior During Therapy WFL for tasks assessed/performed                 Past Medical History:  Diagnosis Date   Allergy    Sesonal   Asthma    as a young child   Obesity    Seizures (HCC)    febrile age of 2. Stare spell- last one at 55 when menes begin   Past Surgical History:  Procedure Laterality Date   ANTERIOR CRUCIATE LIGAMENT REPAIR Right 08/29/2012   Procedure: RIGHT KNEE ANTERIOR CRUCIATE LIGAMENT RECONSTRUCTION, HAMSTRING AUTOGRAFT;  Surgeon: Cammy Copa, MD;  Location: [facility] OR;  Service: Orthopedics;  Laterality: Right;   There are no active problems to display for this patient.   PCP: Fleet Contras, MD  REFERRING PROVIDER: Ozella Rocks, MD  REFERRING DIAG: Bilateral knee, foot, and shoulder pain, thoracic pain   THERAPY DIAG:  Hypermobility of bilateral knees and ankle/feet. Muscular performance deficits of Bil Les.  Rationale for Evaluation and Treatment: Rehabilitation  ONSET DATE: Over a year ago  SUBJECTIVE:   SUBJECTIVE STATEMENT: 04/13/2023 States she has been having a little bit pain after the fall with the TV. Pain is mostly on the left periscapular pain. Pain comes and goes. States sudden movements cause the pain. Dull pain no stabbing pain. Ice and massage gun help. Yoga ball stretch helped.    Eval: Pt states from January 2023 until now, she lost > 100 lbs and has developed increasing pain in her knees, feet, and bilateral  shoulders. Pt's foot pain started a few years ago, knee pain within the last year, and shoulder pain within last of weeks. Pt states her MD told her a probable cause was the changing of her body mechanics secondary to weight loss. Aggravating factors for her knees & feet include, stairs, jumping, running, working out, & speed walking. Alleviating factors include, Voltaren, ice, heat or Ibuprofen. Pt states she does experience N/T in her feet, but believes it may be caused by the new compression sleeves her MD told her to use. Pt had L ACL surgery 10 years ago.   PERTINENT HISTORY: Acl surgery 2014,   PAIN:  Are you having pain? Yes: NPRS scale: 2/10 Pain location: mid back  Pain description: tender, sore  Aggravating factors: driving long periods, holding arms up  Relieving factors: DN, horizontal      PRECAUTIONS: None  RED FLAGS: None   WEIGHT BEARING RESTRICTIONS: No  FALLS:  Has patient fallen in last 6 months? No  LIVING ENVIRONMENT: Lives with: lives with their family Lives in: House/apartment Stairs: Yes: External: 2 floors steps; can reach both Has following equipment at home: None  OCCUPATION: works at a desk  PLOF: Independent  PATIENT GOALS: improve ability to navigate stairs, run, speed walking, jump, and workout with reduced pain/limitation  NEXT MD VISIT: February 2025  OBJECTIVE:  Note: Objective measures were completed at Evaluation unless otherwise noted.  DIAGNOSTIC FINDINGS:   PATIENT SURVEYS:  Knee  FOTO 64 (12/21/22) 12/18  81%  Predicted 76%   COGNITION: Overall cognitive status: Within functional limits for tasks assessed     SENSATION: WFL   MUSCLE LENGTH: Hamstrings: Right WFL ; Left WFL    POSTURE: min R knee valgus and mild ankle overpronation (R>L). Bil knee hyperextension.  Waist measurement: 40.75 inches waist - Day 2 of cycle at the end of the day.   PALPATION: Tenderness to palpation along B UT and increased resting tone  noted B  LOWER EXTREMITY ROM: Hip ROM: WFL ,  Knee ROM: WFL hypermobile extension bilaterally,  Ankle ROM: WFL, hypermobile inversion  LOWER EXTREMITY MMT:  MMT Right 12/18 Left 12/18  Hip flexion    Hip extension 5 5  Hip abduction 5 5  Hip adduction    Hip internal rotation    Hip external rotation    Knee flexion 5 5  Knee extension 5 5  Ankle dorsiflexion    Ankle plantarflexion    Ankle inversion    Ankle eversion     (Blank rows = not tested)    UE Measurements Upper Extremity Right 12/18 Left 12/19   A/PROM MMT A/PROM MMT  Shoulder Flexion WFL^ 4+ WFL 4*  Shoulder Extension      Shoulder Abduction  4*  4  Shoulder Adduction      Shoulder Internal Rotation Reaches to T8 SP* 4-* Reaches to T6 SP 4  Shoulder External Rotation Reaches to T6 SP 3+* Reaches to T6 SP 3+  Elbow Flexion      Elbow Extension      Wrist Flexion      Wrist Extension      Wrist Supination      Wrist Pronation      Wrist Ulnar Deviation      Wrist Radial Deviation      Grip Strength NA  NA     (Blank rows = not tested)   * pain ^ tight    FUNCTIONAL TESTS:  02/02/23 - predominantly anterior/upper chest breather  Previously tested Single Leg hop Test:  R- Pt able to hop for 10 secs before stopping d/t pain in foot.  L- Pt able to hop for 8 secs before stopping d/t pain in L knee   GAIT: Previously tested Distance walked: 25 ft Assistive device utilized: None Level of assistance: Complete Independence Comments: R dynamic knee valgus and overpronation of R foot   TODAY'S TREATMENT:                                                                                                                              DATE: 04/13/2023 Therapeutic Exercise: Review of HEP/stretches/strengthening - exercises and positions to avoid with recent periscapular pain - 13 minutes   Prone laying over large ball 5 minutes  S/L: Seated:  Standing:  Stretches:  Neuromuscular Re-education:   Standing: modified artemis with bend over position with vacuum 10 minutes - prior demonstration --> then hands to shins then hands to floor - 10 minutes      Therapeutic Activity:   Manual Therapy: Self Care:    PATIENT EDUCATION:  Education:HEP, posture Person educated: Patient Education method: Explanation, Demonstration, and Verbal cues Education comprehension: verbalized understanding and returned demonstration  HOME EXERCISE PROGRAM: [id number]  ASSESSMENT:  CLINICAL IMPRESSION:  04/13/2023 Reviewed HEP and positional relief with recent increase in pain. Added standing exercises to HEP. Tolerated this well. Reduced pain noted during session. Added new exercises to HEP. Overall patient down well will continue with current POC as tolerated.   Eval: Patient is a 28 y.o.f who was seen today for physical therapy evaluation and treatment for bil foot and knee pain. Primary impairments include hypermobility of Bil knees and feet/ankles and muscular performance deficits of bil knee & feet/ankles. She also has bilateral shoulder pain, plan to give pt HEP for this in future sessions, but will focus on knee and ankle pain at this time. These impairments limit her ability to run, speed walk, navigate stairs, jump, and workout without pain/limitation. Pt will benefit from skilled physical therapy to work on these impairments.  OBJECTIVE IMPAIRMENTS: decreased activity tolerance, decreased coordination, decreased endurance, decreased strength, and pain.   ACTIVITY LIMITATIONS: carrying, lifting, bending, standing, squatting, stairs, and locomotion level  PARTICIPATION LIMITATIONS: working out with Systems analyst, exercises such as pushups (pain in wrists), planks (pain in wrists), pain in knees- KB swings, squats, & jumping jacks   PERSONAL FACTORS: Past/current experiences are also affecting patient's functional outcome.   REHAB POTENTIAL: Good  CLINICAL DECISION MAKING:  Stable/uncomplicated  EVALUATION COMPLEXITY: Low   GOALS: Goals reviewed with patient? Yes  SHORT TERM GOALS: Target date: 03/29/23  Pt will be independent in initial HEP Goal status: MET  2.  Pt will report ability to speed walk with less than 3/10 pain.   Goal status: MET   3.  Pt will  be able to drive up to 2 hours without unbearable thoracic pain  Goal status: NEW   4.  Patient will demonstrate painfree B shoulder ROM in all directions  Goal status: NEW    LONG TERM GOALS: Target date: 04/28/2023  Pt will report 75% improvement in the ability to navigate stairs.  Goal status: MET  2.  Pt will report ability to run for 10 minutes with <3/10 pain in her knees & feet.  Goal status: PROGRESSING  3.  Pt will report 75% improvement in the her knee and ankle pain with LE workout   Goal status: MET  4.   Pt will demo ability to perform 20 seconds of single leg hops to promote ability to return to running.   Goal status: PROGRESSING  5.  Patient will be able to demonstrate vacuum breathing maneuver with good lower rib expansion to demonstrate reduced intra-abdominal pressure  Goal status: NEW   6.  Patient will painfree MMT in bilateral UE.  Goal status: NEW    PLAN:  PT FREQUENCY: 1-2x/week  PT DURATION: 8 weeks  PLANNED INTERVENTIONS: 97110-Therapeutic exercises, 97530- Therapeutic activity, 97112- Neuromuscular re-education, 97535- Self Care, [id number]- Manual therapy, [phone]- Ionotophoresis 4mg /ml Dexamethasone, Patient/Family education, Balance training, Stair training, Taping, Dry Needling, Joint mobilization, Joint manipulation, Spinal manipulation, Spinal mobilization, Cryotherapy, and Moist heat  PLAN FOR NEXT SESSION:  - progress ankle/LE stability exercises -progress breathing exercises and  follow hypopressive protocol - add in quadruped strengthening and scapular strengthening exercises    3:18 PM, 04/13/23 Tereasa Coop, DPT Physical Therapy  with [facility]

## 2023-04-27 ENCOUNTER — Encounter: Payer: Self-pay | Admitting: Physical Therapy

## 2023-04-27 ENCOUNTER — Ambulatory Visit (INDEPENDENT_AMBULATORY_CARE_PROVIDER_SITE_OTHER): Payer: 59 | Admitting: Physical Therapy

## 2023-04-27 DIAGNOSIS — M25561 Pain in right knee: Secondary | ICD-10-CM | POA: Diagnosis not present

## 2023-04-27 DIAGNOSIS — M542 Cervicalgia: Secondary | ICD-10-CM

## 2023-04-27 DIAGNOSIS — M546 Pain in thoracic spine: Secondary | ICD-10-CM | POA: Diagnosis not present

## 2023-04-27 DIAGNOSIS — G8929 Other chronic pain: Secondary | ICD-10-CM

## 2023-04-27 DIAGNOSIS — M25571 Pain in right ankle and joints of right foot: Secondary | ICD-10-CM

## 2023-04-27 DIAGNOSIS — M6281 Muscle weakness (generalized): Secondary | ICD-10-CM | POA: Diagnosis not present

## 2023-04-27 DIAGNOSIS — M25562 Pain in left knee: Secondary | ICD-10-CM

## 2023-04-27 NOTE — Therapy (Signed)
 OUTPATIENT PHYSICAL THERAPY Treatment   PHYSICAL THERAPY DISCHARGE SUMMARY  Visits from Start of Care: 18  Current functional level related to goals / functional outcomes: See below   Remaining deficits: See below   Education / Equipment: See below   Patient agrees to discharge. Patient goals were met. Patient is being discharged due to being pleased with the current functional level.   Patient Name: Hailey Patrick MRN: [id number] DOB:07/12/1995, 28 y.o., female Today's Date: 04/27/2023   END OF SESSION:  PT End of Session - 04/27/23 1255     Visit Number 18    Number of Visits 23    Date for PT Re-Evaluation 04/28/23    Authorization Type [employer]    PT Start Time 1300    PT Stop Time 1342    PT Time Calculation (min) 42 min    Activity Tolerance Patient tolerated treatment well;No increased pain    Behavior During Therapy WFL for tasks assessed/performed                 Past Medical History:  Diagnosis Date   Allergy    Sesonal   Asthma    as a young child   Obesity    Seizures (HCC)    febrile age of 2. Stare spell- last one at 21 when menes begin   Past Surgical History:  Procedure Laterality Date   ANTERIOR CRUCIATE LIGAMENT REPAIR Right 08/29/2012   Procedure: RIGHT KNEE ANTERIOR CRUCIATE LIGAMENT RECONSTRUCTION, HAMSTRING AUTOGRAFT;  Surgeon: Cammy Copa, MD;  Location: [facility] OR;  Service: Orthopedics;  Laterality: Right;   There are no active problems to display for this patient.   PCP: Fleet Contras, MD  REFERRING PROVIDER: Ozella Rocks, MD  REFERRING DIAG: Bilateral knee, foot, and shoulder pain, thoracic pain   THERAPY DIAG:  Hypermobility of bilateral knees and ankle/feet. Muscular performance deficits of Bil Les.  Rationale for Evaluation and Treatment: Rehabilitation  ONSET DATE: Over a year ago  SUBJECTIVE:   SUBJECTIVE STATEMENT: 04/27/2023 States she has not been able to sleep. States she has  bene have cold sweats and is cold now. States she is still having some lingering back pan from the fall despite the recent massage and chiropractor apt. Got ball and exercises and going well otherwise. Overall feels 85-90% better   Eval: Pt states from January 2023 until now, she lost > 100 lbs and has developed increasing pain in her knees, feet, and bilateral shoulders. Pt's foot pain started a few years ago, knee pain within the last year, and shoulder pain within last of weeks. Pt states her MD told her a probable cause was the changing of her body mechanics secondary to weight loss. Aggravating factors for her knees & feet include, stairs, jumping, running, working out, & speed walking. Alleviating factors include, Voltaren, ice, heat or Ibuprofen. Pt states she does experience N/T in her feet, but believes it may be caused by the new compression sleeves her MD told her to use. Pt had L ACL surgery 10 years ago.   PERTINENT HISTORY: Acl surgery 2014,   PAIN:  Are you having pain? Yes: NPRS scale: 3/10 Pain location: neck and shoulders  Pain description: dull and constant   Aggravating factors: driving long periods, holding arms up  Relieving factors: DN, horizontal      PRECAUTIONS: None  RED FLAGS: None   WEIGHT BEARING RESTRICTIONS: No  FALLS:  Has patient fallen in last 6 months? No  LIVING ENVIRONMENT: Lives with: lives with their family Lives in: House/apartment Stairs: Yes: External: 2 floors steps; can reach both Has following equipment at home: None  OCCUPATION: works at a desk  PLOF: Independent  PATIENT GOALS: improve ability to navigate stairs, run, speed walking, jump, and workout with reduced pain/limitation  NEXT MD VISIT:   OBJECTIVE:  Note: Objective measures were completed at Evaluation unless otherwise noted.  DIAGNOSTIC FINDINGS:   PATIENT SURVEYS:  Knee  FOTO 64 (12/21/22) 12/18  81%  Predicted 76%   COGNITION: Overall cognitive status:  Within functional limits for tasks assessed     SENSATION: WFL   MUSCLE LENGTH: Hamstrings: Right WFL ; Left WFL    POSTURE: min R knee valgus and mild ankle overpronation (R>L). Bil knee hyperextension.  Waist measurement: 40.75 inches waist - Day 2 of cycle at the end of the day.     LOWER EXTREMITY MMT:  MMT Right 12/18 Left 12/18  Hip flexion    Hip extension 5 5  Hip abduction 5 5  Hip adduction    Hip internal rotation    Hip external rotation    Knee flexion 5 5  Knee extension 5 5  Ankle dorsiflexion    Ankle plantarflexion    Ankle inversion    Ankle eversion     (Blank rows = not tested)    UE Measurements Upper Extremity Right 3/12 Left 3/12   A/PROM MMT A/PROM MMT  Shoulder Flexion WFL 5 WFL 5  Shoulder Extension      Shoulder Abduction  5  5  Shoulder Adduction      Shoulder Internal Rotation Reaches to T8 SP 4+ Reaches to T6 SP 4+  Shoulder External Rotation Reaches to T6 SP 4+ Reaches to T6 SP 4+  Elbow Flexion      Elbow Extension      Wrist Flexion      Wrist Extension      Wrist Supination      Wrist Pronation      Wrist Ulnar Deviation      Wrist Radial Deviation      Grip Strength NA  NA     (Blank rows = not tested)   * pain ^ tight       TODAY'S TREATMENT:                                                                                                                              DATE: 04/27/2023 Therapeutic Exercise: Review of HEP/objective measures updated/answered all questions and reviewed progression of exercises. 25 mintues   Seated:   Standing:  Stretches:     Therapeutic Activity:   Manual Therapy: Self Care: sleep strategies - habits- journaling - time released melatonin difference to regular melatonin, on cognitive reshuffling. 15 minutes   PATIENT EDUCATION:  Education:HEP, posture Person educated: Patient Education method: Explanation, Demonstration, and Verbal cues Education comprehension: verbalized  understanding and returned demonstration  HOME EXERCISE PROGRAM: [id number]  ASSESSMENT:  CLINICAL IMPRESSION:  04/27/2023 All but running goal met at this time but patient has just not added running back in. Answered all questions and reviewed HEP. Overall patient doing very well. Biggest concern at this time is recent sleep difficulties. Discussed and educated patient in improved sleeping habits and to f/u with MD about recent cold sweats. Patient happy with current presentation and is to DC from PT to HEP at this time.   Eval: Patient is a 28 y.o.f who was seen today for physical therapy evaluation and treatment for bil foot and knee pain. Primary impairments include hypermobility of Bil knees and feet/ankles and muscular performance deficits of bil knee & feet/ankles. She also has bilateral shoulder pain, plan to give pt HEP for this in future sessions, but will focus on knee and ankle pain at this time. These impairments limit her ability to run, speed walk, navigate stairs, jump, and workout without pain/limitation. Pt will benefit from skilled physical therapy to work on these impairments.  OBJECTIVE IMPAIRMENTS: decreased activity tolerance, decreased coordination, decreased endurance, decreased strength, and pain.   ACTIVITY LIMITATIONS: carrying, lifting, bending, standing, squatting, stairs, and locomotion level  PARTICIPATION LIMITATIONS: working out with Systems analyst, exercises such as pushups (pain in wrists), planks (pain in wrists), pain in knees- KB swings, squats, & jumping jacks   PERSONAL FACTORS: Past/current experiences are also affecting patient's functional outcome.   REHAB POTENTIAL: Good  CLINICAL DECISION MAKING: Stable/uncomplicated  EVALUATION COMPLEXITY: Low   GOALS: Goals reviewed with patient? Yes  SHORT TERM GOALS: Target date: 03/29/23  Pt will be independent in initial HEP Goal status: MET  2.  Pt will report ability to speed walk with less  than 3/10 pain.   Goal status: MET   3.  Pt will  be able to drive up to 2 hours without unbearable thoracic pain  Goal status: MET   4.  Patient will demonstrate painfree B shoulder ROM in all directions  Goal status: MET    LONG TERM GOALS: Target date: 04/28/2023  Pt will report 75% improvement in the ability to navigate stairs.  Goal status: MET  2.  Pt will report ability to run for 10 minutes with <3/10 pain in her knees & feet.  Goal status: PROGRESSING <5  3.  Pt will report 75% improvement in the her knee and ankle pain with LE workout   Goal status: MET  4.   Pt will demo ability to perform 20 seconds of single leg hops to promote ability to return to running.   Goal status: MET  5.  Patient will be able to demonstrate vacuum breathing maneuver with good lower rib expansion to demonstrate reduced intra-abdominal pressure  Goal status: MET   6.  Patient will painfree MMT in bilateral UE.  Goal status: MET    PLAN:  PT FREQUENCY: 1-2x/week  PT DURATION: 8 weeks  PLANNED INTERVENTIONS: 97110-Therapeutic exercises, 97530- Therapeutic activity, 97112- Neuromuscular re-education, 97535- Self Care, 47425- Manual therapy, [id number]- Ionotophoresis 4mg /ml Dexamethasone, Patient/Family education, Balance training, Stair training, Taping, Dry Needling, Joint mobilization, Joint manipulation, Spinal manipulation, Spinal mobilization, Cryotherapy, and Moist heat  PLAN FOR NEXT SESSION:  - progress ankle/LE stability exercises -progress breathing exercises and follow hypopressive protocol - add in quadruped strengthening and scapular strengthening exercises    1:51 PM, 04/27/23 Tereasa Coop, DPT Physical Therapy with [facility]

## 2023-05-11 ENCOUNTER — Encounter: Payer: 59 | Admitting: Physical Therapy

## 2023-05-12 ENCOUNTER — Encounter: Payer: 59 | Admitting: Nurse Practitioner

## 2023-05-20 ENCOUNTER — Other Ambulatory Visit: Payer: Self-pay | Admitting: Family Medicine

## 2023-05-20 DIAGNOSIS — M542 Cervicalgia: Secondary | ICD-10-CM

## 2023-05-20 NOTE — Progress Notes (Signed)
 Neck pain at base of skull into neck. Acute onset after Yoga w/o improvement after 10 days.

## 2023-05-24 ENCOUNTER — Ambulatory Visit
Admission: RE | Admit: 2023-05-24 | Discharge: 2023-05-24 | Disposition: A | Discharge status: Home / Self Care | Source: Ambulatory Visit | Attending: Family Medicine | Admitting: Family Medicine

## 2023-05-24 DIAGNOSIS — M542 Cervicalgia: Secondary | ICD-10-CM

## 2023-06-20 ENCOUNTER — Ambulatory Visit (INDEPENDENT_AMBULATORY_CARE_PROVIDER_SITE_OTHER): Admitting: Nurse Practitioner

## 2023-06-20 ENCOUNTER — Encounter: Payer: Self-pay | Admitting: Nurse Practitioner

## 2023-06-20 VITALS — BP 124/70 | HR 68 | Ht 63.5 in | Wt 246.0 lb

## 2023-06-20 DIAGNOSIS — N926 Irregular menstruation, unspecified: Secondary | ICD-10-CM

## 2023-06-20 DIAGNOSIS — Z30018 Encounter for initial prescription of other contraceptives: Secondary | ICD-10-CM | POA: Diagnosis not present

## 2023-06-20 DIAGNOSIS — Z6841 Body Mass Index (BMI) 40.0 and over, adult: Secondary | ICD-10-CM

## 2023-06-20 DIAGNOSIS — Z833 Family history of diabetes mellitus: Secondary | ICD-10-CM | POA: Diagnosis not present

## 2023-06-20 DIAGNOSIS — L680 Hirsutism: Secondary | ICD-10-CM

## 2023-06-20 MED ORDER — PHEXXI 1.8-1-0.4 % VA GEL: 1.0000 | Freq: Every day | VAGINAL | 1 refills | Status: AC

## 2023-06-20 NOTE — Progress Notes (Signed)
   Acute Office Visit  Subjective:    Patient ID: Hailey Patrick, female    DOB: 1995-05-22, 28 y.o.   MRN: [id number]   HPI 28 y.o. G0 presents  as new patient to discuss periods and birth control. Periods have been irregular since menarche at age 4 or 38. Periods ranged from 2 weeks to 3 months. Started OCPs at age 89, then did Nexplanon for 6 years. Did POPs but bled constantly with use and has now been on COCs for about a year. Initially had regular periods but now has not had period for months. When she does have menses she bleeds for about 1 day. Sexually active. No desire for children. Has some facial hair but feels this could be genetic, denies acne, difficulty with weight loss. Has lost 70 pounds in the last couple of years. Mother with history of diabetes. H/O infertility in family.   No LMP recorded.    Review of Systems  Constitutional: Negative.   Endocrine: Negative for cold intolerance and heat intolerance.  Genitourinary:  Positive for menstrual problem.       Objective:    Physical Exam Constitutional:      Appearance: Normal appearance. She is obese.   GU: Not indicated  BP 124/70   Pulse 68   Ht 5' 3.5" (1.613 m)   Wt 246 lb (111.6 kg)   SpO2 100%   BMI 42.89 kg/m  Wt Readings from Last 3 Encounters:  06/20/23 246 lb (111.6 kg)  04/22/15 220 lb (99.8 kg) (99%, Z= 2.21)*  08/29/12 212 lb (96.2 kg) (98%, Z= 2.16)*   * Growth percentiles are based on CDC (Girls, 2-20 Years) data.        Assessment & Plan:   Problem List Items Addressed This Visit   None Visit Diagnoses       Irregular periods    -  Primary   Relevant Orders   17-Hydroxyprogesterone   Estradiol   Follicle stimulating hormone   Luteinizing hormone   TSH     BMI 40.0-44.9, adult (HCC)       Relevant Orders   Hemoglobin A1c     Family history of diabetes mellitus in mother       Relevant Orders   Hemoglobin A1c     Encounter for initial prescription of other  contraceptives       Relevant Medications   Lactic Ac-Citric Ac-Pot Bitart (PHEXXI) 1.8-1-0.4 % GEL     Hirsutism       Relevant Orders   Testos,Total,Free and SHBG (Female)      Plan: Stop COCs for 2 months. Return for labs at that time for PCOS workup. Phexxi gel for contraception, aware sent to specialty pharmacy. TSH, A1C, female hormone panel pending.   Return in about 2 months (around 08/20/2023) for Labs only.    Andee Bamberger DNP, 11:55 AM 06/20/2023

## 2023-08-22 ENCOUNTER — Other Ambulatory Visit

## 2023-08-22 DIAGNOSIS — N926 Irregular menstruation, unspecified: Secondary | ICD-10-CM

## 2023-08-22 DIAGNOSIS — Z833 Family history of diabetes mellitus: Secondary | ICD-10-CM

## 2023-08-22 DIAGNOSIS — L680 Hirsutism: Secondary | ICD-10-CM

## 2023-08-22 DIAGNOSIS — Z6841 Body Mass Index (BMI) 40.0 and over, adult: Secondary | ICD-10-CM

## 2023-08-24 ENCOUNTER — Encounter: Payer: Self-pay | Admitting: Nurse Practitioner

## 2023-08-24 NOTE — Telephone Encounter (Signed)
 Testosterone pending from 7/7.   Tiffany -please advise if OV recommended.

## 2023-08-25 LAB — TESTOS,TOTAL,FREE AND SHBG (FEMALE)
Free Testosterone: 5 pg/mL (ref 0.1–6.4)
Sex Hormone Binding: 39 nmol/L (ref 17–124)
Testosterone, Total, LC-MS-MS: 38 ng/dL (ref 2–45)

## 2023-08-25 LAB — LUTEINIZING HORMONE: LH: 33.5 m[IU]/mL

## 2023-08-25 LAB — HEMOGLOBIN A1C
Hgb A1c MFr Bld: 5.3 % (ref <5.7)
Mean Plasma Glucose: 105 mg/dL
eAG (mmol/L): 5.8 mmol/L

## 2023-08-25 LAB — ESTRADIOL: Estradiol: 244 pg/mL

## 2023-08-25 LAB — FOLLICLE STIMULATING HORMONE: FSH: 11.9 m[IU]/mL

## 2023-08-25 LAB — TSH: TSH: 0.77 m[IU]/L

## 2023-08-26 ENCOUNTER — Ambulatory Visit: Payer: Self-pay | Admitting: Nurse Practitioner

## 2023-08-26 LAB — 17-HYDROXYPROGESTERONE: 17-OH-Progesterone, LC/MS/MS: 179 ng/dL

## 2023-08-31 NOTE — Progress Notes (Unsigned)
   Acute Office Visit  Subjective:    Patient ID: Hailey Patrick, female    DOB: 09/25/1995, 28 y.o.   MRN: [id number]   HPI 28 y.o. presents today for follow up. Seen as new patient 06/20/2023 with complaints of irregular periods since menarche at age 89 or 28. Periods ranged from 2 weeks to 3 months. Started OCPs at age 75, then did Nexplanon  for 6 years. Did POPs but bled constantly with use and has now been on COCs for about a year. Initially had regular periods but now has not had period for months. When she does have menses she bleeds for about 1 day. Stopped OCPs for 2 months prior to labs. Normal TSH, A1c and female hormone panel. Sexually active. No desire for children. Has some facial hair but feels this could be genetic, denies acne, difficulty with weight loss. Has lost 70 pounds in the last couple of years. Mother with history of diabetes.   No LMP recorded.    Review of Systems     Objective:    Physical Exam  There were no vitals taken for this visit. Wt Readings from Last 3 Encounters:  06/20/23 246 lb (111.6 kg)  04/22/15 220 lb (99.8 kg) (99%, Z= 2.21)*  08/29/12 212 lb (96.2 kg) (98%, Z= 2.16)*   * Growth percentiles are based on CDC (Girls, 2-20 Years) data.        Zada Louder, CMA present as chaperone.   Assessment & Plan:   Problem List Items Addressed This Visit   None   No follow-ups on file.    Annabella DELENA Shutter DNP, 2:29 PM 08/31/2023

## 2023-09-01 ENCOUNTER — Ambulatory Visit (INDEPENDENT_AMBULATORY_CARE_PROVIDER_SITE_OTHER): Admitting: Nurse Practitioner

## 2023-09-01 ENCOUNTER — Encounter: Payer: Self-pay | Admitting: Nurse Practitioner

## 2023-09-01 VITALS — BP 120/80

## 2023-09-01 DIAGNOSIS — Z30015 Encounter for initial prescription of vaginal ring hormonal contraceptive: Secondary | ICD-10-CM

## 2023-09-01 DIAGNOSIS — N926 Irregular menstruation, unspecified: Secondary | ICD-10-CM

## 2023-09-01 MED ORDER — ETONOGESTREL-ETHINYL ESTRADIOL 0.12-0.015 MG/24HR VA RING: 1.0000 | VAGINAL_RING | VAGINAL | 1 refills | Status: DC

## 2023-09-26 ENCOUNTER — Telehealth: Payer: Self-pay

## 2023-09-26 NOTE — Telephone Encounter (Signed)
 She was scheduled for an US  8/12 @ 10:30 am. When I called she wanted to cancel as she feel its not necessary and was happy with her last results given to her. I mentioned the Patient Responsibility and let her know if it is too much she can do a payment plan. Patient declined.

## 2023-09-27 ENCOUNTER — Other Ambulatory Visit: Admitting: Nurse Practitioner

## 2023-09-27 ENCOUNTER — Other Ambulatory Visit

## 2024-02-09 ENCOUNTER — Other Ambulatory Visit: Payer: Self-pay | Admitting: Nurse Practitioner

## 2024-02-09 DIAGNOSIS — Z30015 Encounter for initial prescription of vaginal ring hormonal contraceptive: Secondary | ICD-10-CM

## 2024-02-09 DIAGNOSIS — N926 Irregular menstruation, unspecified: Secondary | ICD-10-CM

## 2024-02-10 NOTE — Telephone Encounter (Signed)
 Med refill request:     etonogestrel -ethinyl estradiol  (NUVARING) 0.12-0.015 MG/24HR vaginal ring  Start:  09/01/23 Disp: 3 each Refills:  1  Last OV:  09/01/23 Next AEX:  Not yet scheduled Last MMG (if hormonal med):  N/A Refill authorized? Please Advise.
